# Patient Record
Sex: Female | Born: 1985 | Race: White | Hispanic: Yes | Marital: Married | Smoking: Never smoker
Health system: Southern US, Community
[De-identification: ages and names within clinical notes are randomized; demographics above are authoritative.]

## PROBLEM LIST (undated history)

## (undated) ENCOUNTER — Inpatient Hospital Stay (HOSPITAL_COMMUNITY): Payer: Medicaid Other

---

## 2007-04-10 LAB — CONVERTED CEMR LAB: Pap Smear: NORMAL

## 2008-01-03 ENCOUNTER — Ambulatory Visit: Payer: Self-pay | Admitting: Nurse Practitioner

## 2008-01-03 DIAGNOSIS — S139XXA Sprain of joints and ligaments of unspecified parts of neck, initial encounter: Secondary | ICD-10-CM | POA: Insufficient documentation

## 2008-01-03 LAB — CONVERTED CEMR LAB
Basophils Absolute: 0 10*3/uL (ref 0.0–0.1)
Chloride: 105 meq/L (ref 96–112)
Eosinophils Absolute: 0.1 10*3/uL (ref 0.0–0.7)
HCT: 39.1 % (ref 36.0–46.0)
Hemoglobin: 13.2 g/dL (ref 12.0–15.0)
Lymphocytes Relative: 39 % (ref 12–46)
Lymphs Abs: 3 10*3/uL (ref 0.7–4.0)
MCHC: 33.8 g/dL (ref 30.0–36.0)
MCV: 88.9 fL (ref 78.0–100.0)
Monocytes Relative: 7 % (ref 3–12)
Neutrophils Relative %: 52 % (ref 43–77)
Platelets: 269 10*3/uL (ref 150–400)
Total Bilirubin: 0.6 mg/dL (ref 0.3–1.2)
Total Protein: 7.8 g/dL (ref 6.0–8.3)
hCG, Beta Chain, Quant, S: 181.9 milliintl units/mL

## 2008-01-21 ENCOUNTER — Encounter (INDEPENDENT_AMBULATORY_CARE_PROVIDER_SITE_OTHER): Payer: Self-pay | Admitting: Nurse Practitioner

## 2008-02-17 ENCOUNTER — Inpatient Hospital Stay (HOSPITAL_COMMUNITY): Admission: AD | Admit: 2008-02-17 | Discharge: 2008-02-17 | Payer: Self-pay | Admitting: Obstetrics & Gynecology

## 2008-03-07 ENCOUNTER — Ambulatory Visit (HOSPITAL_COMMUNITY): Admission: RE | Admit: 2008-03-07 | Discharge: 2008-03-07 | Payer: Self-pay | Admitting: Family Medicine

## 2008-03-28 ENCOUNTER — Ambulatory Visit (HOSPITAL_COMMUNITY): Admission: RE | Admit: 2008-03-28 | Discharge: 2008-03-28 | Payer: Self-pay | Admitting: Family Medicine

## 2008-04-14 ENCOUNTER — Ambulatory Visit (HOSPITAL_COMMUNITY): Admission: RE | Admit: 2008-04-14 | Discharge: 2008-04-14 | Payer: Self-pay | Admitting: Family Medicine

## 2008-06-04 ENCOUNTER — Ambulatory Visit (HOSPITAL_COMMUNITY): Admission: RE | Admit: 2008-06-04 | Discharge: 2008-06-04 | Payer: Self-pay | Admitting: Family Medicine

## 2008-07-12 ENCOUNTER — Inpatient Hospital Stay (HOSPITAL_COMMUNITY): Admission: AD | Admit: 2008-07-12 | Discharge: 2008-08-03 | Payer: Self-pay | Admitting: Family Medicine

## 2008-07-12 ENCOUNTER — Ambulatory Visit: Payer: Self-pay | Admitting: Obstetrics & Gynecology

## 2009-11-08 IMAGING — US US OB COMP LESS 14 WK
1 series · 14 of 19 positions shown · non-contrast
Comparison: None

CLINICAL DATA: Pregnancy,

OBSTETRIC <14 WK US
TECHNIQUE: Transabdominal ultrasound examination was performed for
complete evaluation of the gestation as well as the maternal
uterus, adnexal regions, and pelvic cul-de-sac.

[Series 1: us ob comp less 14 wks · 14 of 19 slices shown]
[im 1/19]
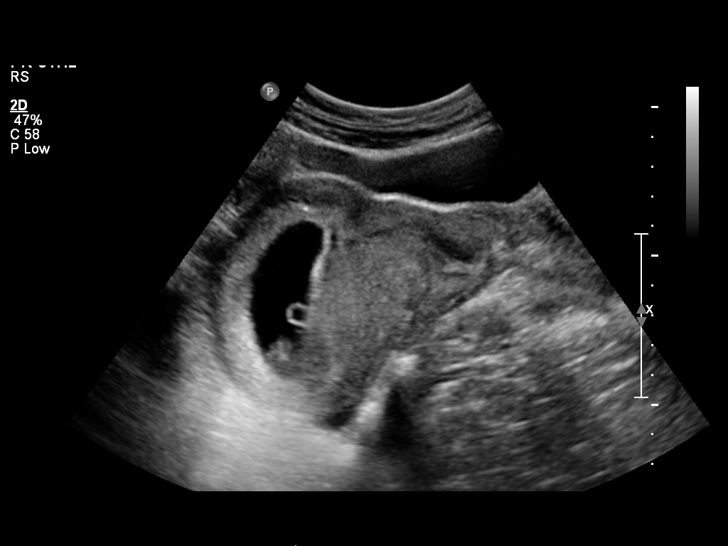
[im 3/19]
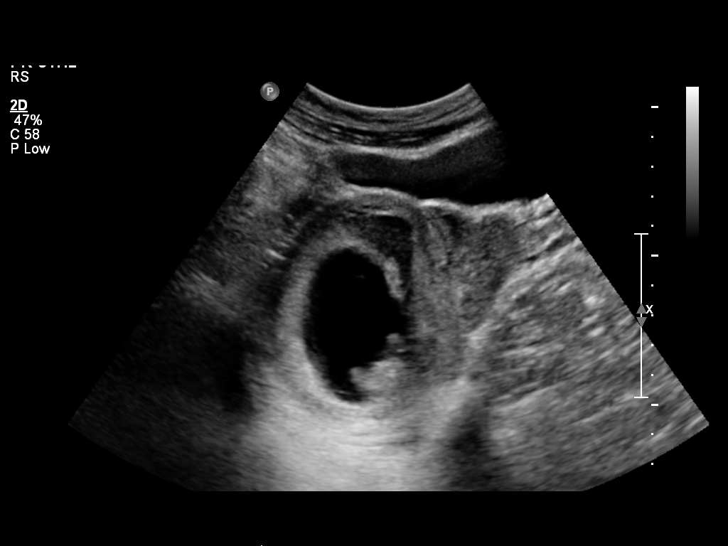
[im 4/19]
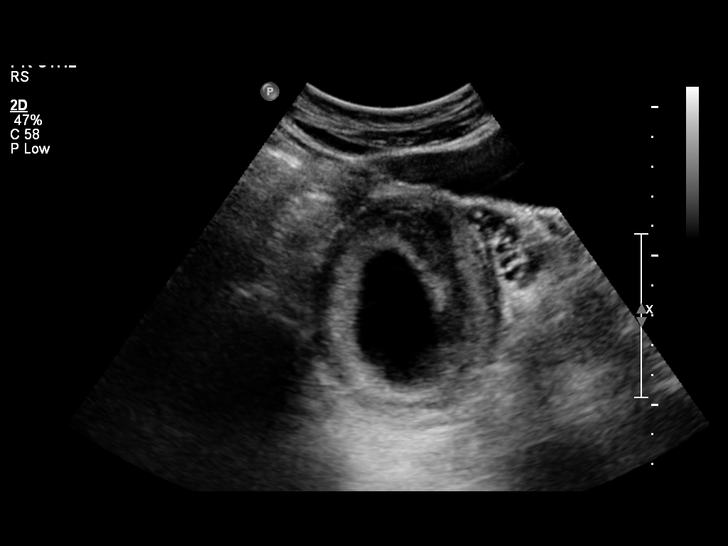
[im 5/19]
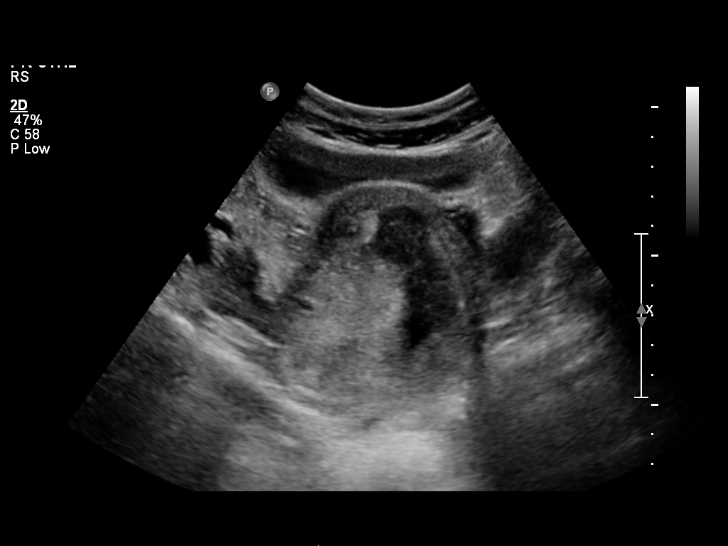
[im 7/19]
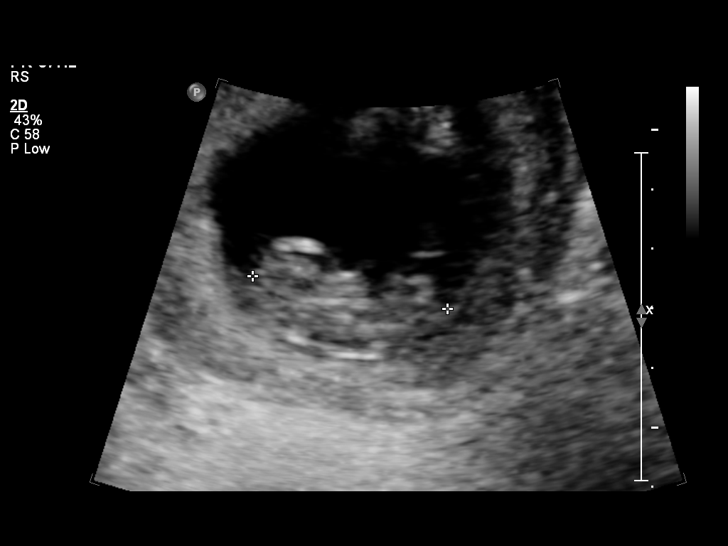
[im 8/19]
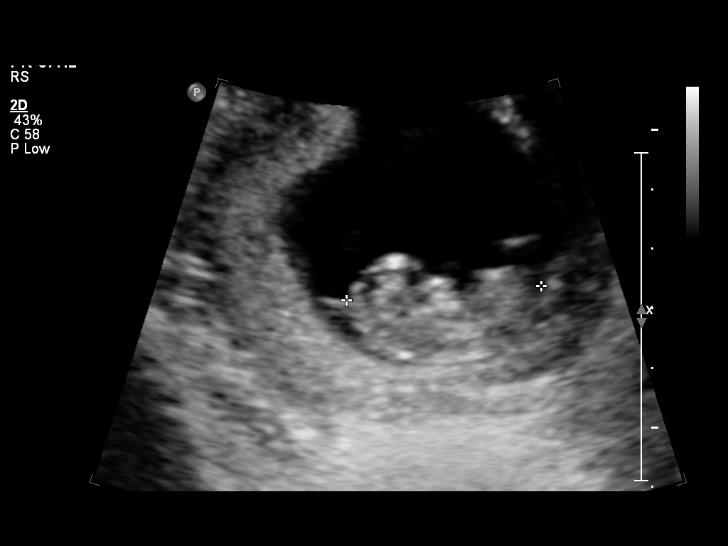
[im 9/19]
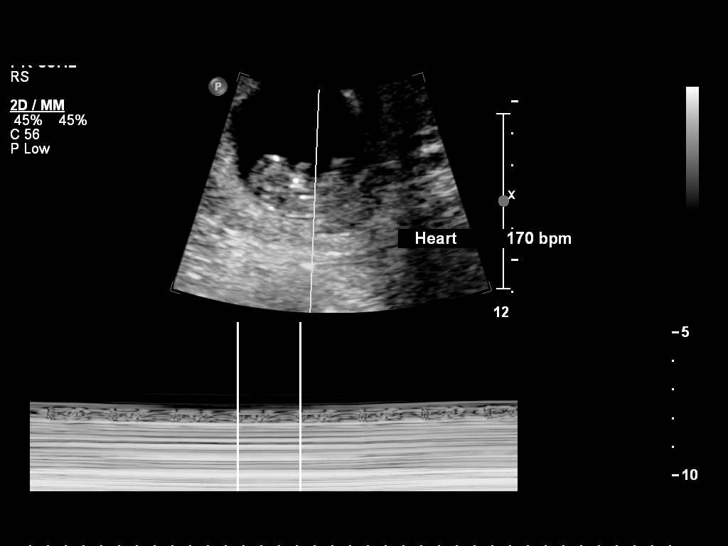
[im 11/19]
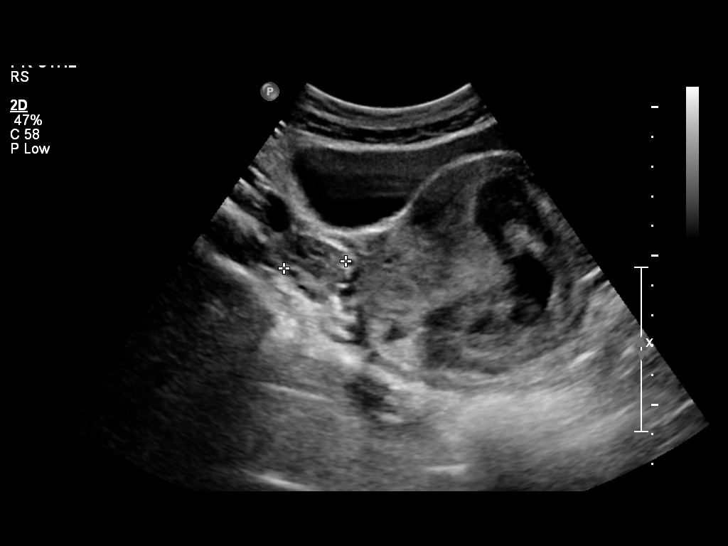
[im 12/19]
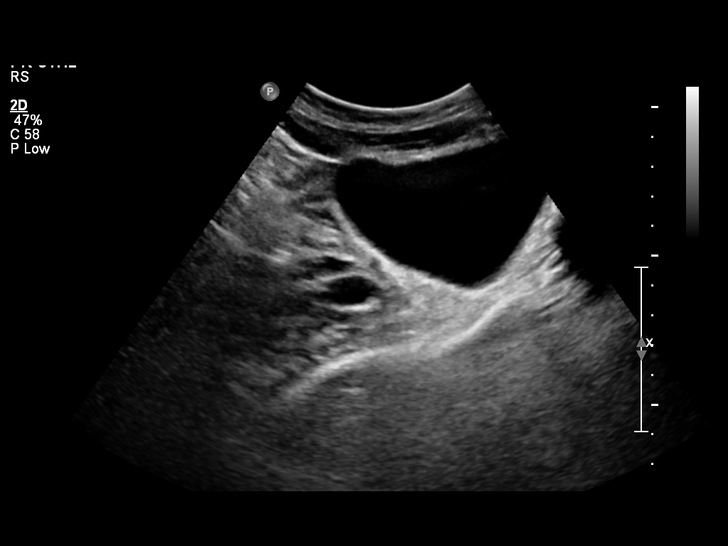
[im 13/19]
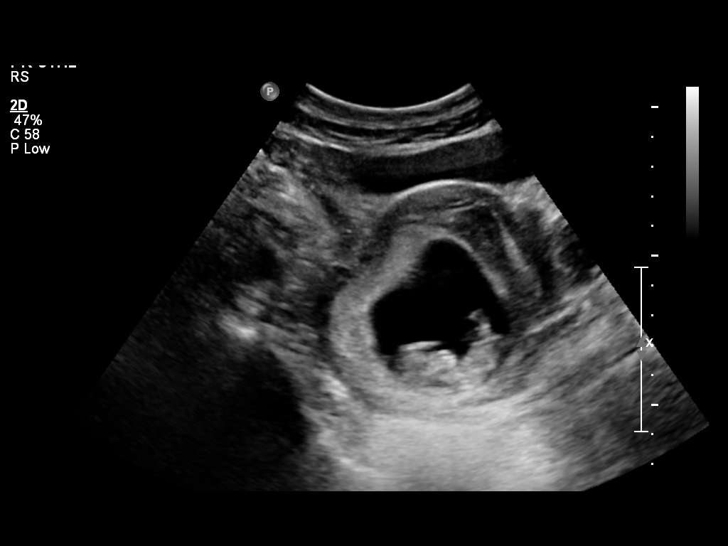
[im 15/19]
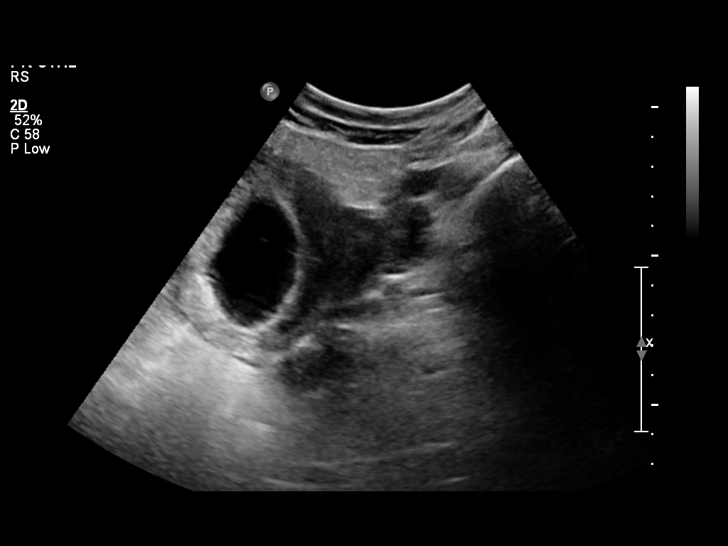
[im 16/19]
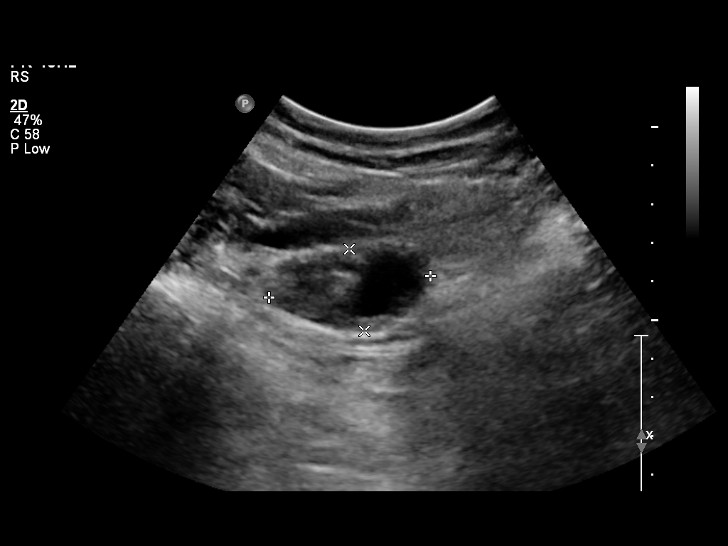
[im 17/19]
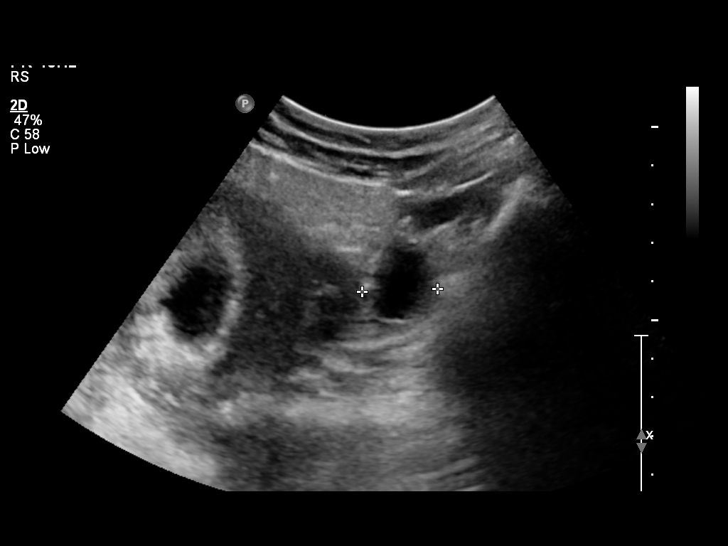
[im 19/19]
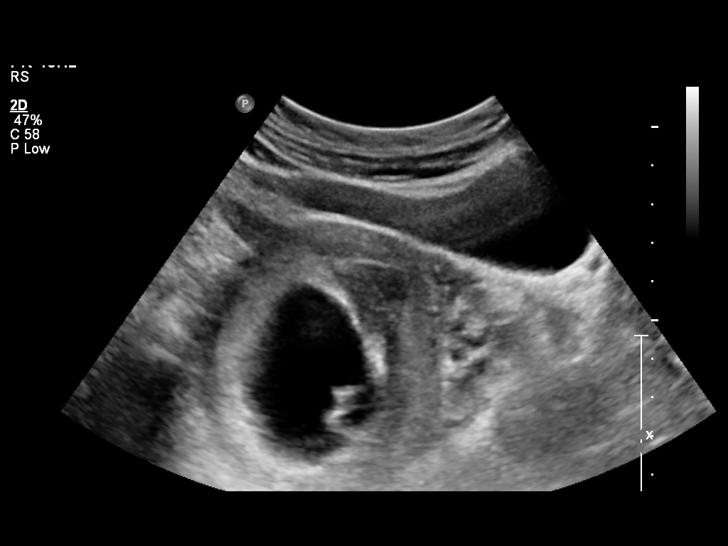

[14 of 19 positions shown; findings below may reference images not displayed]

FINDINGS: There is a single intrauterine gestation, with a crown-
rump length of 33.2 mm.  Estimated gestational age is 10 weeks 2
days.  Fetal heart rate 170 beats per minute.  Small to moderate
subchorionic hemorrhage present.

Ovaries are symmetric in size and echotexture.  No free fluid.
IMPRESSION: Single intrauterine pregnancy, 10 weeks 2 days.  Fetal heart rate
170 beats per minute.

Small to moderate subchorionic hemorrhage.

## 2010-09-21 NOTE — Letter (Signed)
Summary: PATIENT INFORMATION & HISTORY SHEET  PATIENT INFORMATION & HISTORY SHEET   Imported ByArta Bruce 01/04/2008 09:06:28  _____________________________________________________________________  External Attachment:    Type:   Image     Comment:   External Document

## 2011-01-04 NOTE — Discharge Summary (Signed)
[name], [name]               ACCOUNT NO.:  [id number]   MEDICAL RECORD NO.:  [id number]          PATIENT TYPE:  INP   LOCATION:  9316                          FACILITY:  [facility]   PHYSICIAN:  [name], M.D. DATE OF BIRTH:  06-Jun-1986   DATE OF ADMISSION:  07/12/2008  DATE OF DISCHARGE:  08/03/2008                               DISCHARGE SUMMARY   ATTENDING PHYSICIAN:  Karen Dukes, MD   ADMISSION DIAGNOSIS:  Preterm premature rupture of membranes and  observation and evaluation.   DISCHARGE DIAGNOSES:  Premature labor, prolonged rupture of membranes  for 20 days, and preterm delivery.   PROCEDURES DONE DURING HOSPITAL STAY:  Ultrasound, nonstress test, and  spontaneous vaginal delivery.   HISTORY AND HOSPITAL COURSE:  Briefly, this is a 25 year old G3, P2-1-0-  3 with a regular prenatal care at the [facility],  who was admitted at 31 weeks and 1 day with preterm premature rupture of  membranes, was admitted to antenatal unit for observation.  Got her  doses of betamethasone antibiotics for 7 days, Procardia as a tocolytic,  was observed on the antenatal floor for 20 days, and then went onto  induction of labor at 34 weeks.  The patient had no fevers or signs of  infections, and delivered a viable female by NSVD with Apgars of 8 at 1-  minute and 9 at 5 minutes with no anesthesia.  There was a three-vessel  cord.  Cord blood was sent.  There were no lacerations.  Estimated blood  loss was 300 mL.  Baby was sent to NICU in stable condition.  The  patient followed a routine normal postpartum course, and by day of  discharge, was ambulatory, tolerating diet, stooling and voiding and  pain was controlled and there was minimal lochia.  She prefers an IUD  placement, but prefers not to have contraceptive bridge until the  placement of the IUD.  She also prefers to be discharged home and not to  tell status.   Her labs are GBS negative, GC negative,  Chlamydia negative, RPR  nonreactive, blood type O positive, antibody screen negative, hepatitis  B surface antigen negative, rubella immune, varicella immune, and HIV  negative.   ACTIVITY:  Will be pelvic rest for 6 weeks.   DIET:  Will be routine.   MEDICATIONS AT DISCHARGE:  1. Ibuprofen 600 mg p.o. q.4-6 h. p.r.n. pain.  2. Colace 100 mg p.o. b.i.d. p.r.n. constipation.  3. Prenatal vitamins 1 tablet p.o. daily.   DISCHARGE CONDITION:  Stable.   DISCHARGE DISPOSITION:  Will be to home.   FOLLOWUP:  Will be in 6 weeks at the [facility] Department.      Karen Langton, MD      [name], M.D.  Electronically Signed    TT/MEDQ  D:  08/03/2008  T:  08/03/2008  Job:  [id number]

## 2011-05-19 LAB — CBC
Hemoglobin: 11.9 — ABNORMAL LOW
MCHC: 35.2
Platelets: 252
RDW: 14.3
WBC: 9

## 2011-05-19 LAB — URINALYSIS, ROUTINE W REFLEX MICROSCOPIC
Bilirubin Urine: NEGATIVE
Glucose, UA: NEGATIVE
Ketones, ur: NEGATIVE
Nitrite: NEGATIVE
Protein, ur: NEGATIVE
Specific Gravity, Urine: 1.02
Urobilinogen, UA: 0.2
pH: 6

## 2011-05-19 LAB — WET PREP, GENITAL

## 2011-05-19 LAB — GC/CHLAMYDIA PROBE AMP, GENITAL: Chlamydia, DNA Probe: NEGATIVE

## 2011-05-24 LAB — RPR: RPR Ser Ql: NONREACTIVE

## 2011-05-24 LAB — CBC
HCT: 31.3 % — ABNORMAL LOW (ref 36.0–46.0)
HCT: 34.1 % — ABNORMAL LOW (ref 36.0–46.0)
Hemoglobin: 12 g/dL (ref 12.0–15.0)
MCHC: 35.2 g/dL (ref 30.0–36.0)
MCHC: 35.2 g/dL (ref 30.0–36.0)
MCV: 93.4 fL (ref 78.0–100.0)
MCV: 93.8 fL (ref 78.0–100.0)
Platelets: 174 10*3/uL (ref 150–400)
RBC: 3.65 MIL/uL — ABNORMAL LOW (ref 3.87–5.11)
RDW: 14.1 % (ref 11.5–15.5)
WBC: 9.3 10*3/uL (ref 4.0–10.5)

## 2011-05-24 LAB — DIFFERENTIAL
Basophils Absolute: 0 10*3/uL (ref 0.0–0.1)
Lymphocytes Relative: 21 % (ref 12–46)
Monocytes Absolute: 0.8 10*3/uL (ref 0.1–1.0)
Neutrophils Relative %: 68 % (ref 43–77)

## 2011-05-24 LAB — STREP B DNA PROBE: Strep Group B Ag: NEGATIVE

## 2011-05-24 LAB — GC/CHLAMYDIA PROBE AMP, GENITAL: GC Probe Amp, Genital: NEGATIVE

## 2013-06-14 ENCOUNTER — Ambulatory Visit: Payer: Self-pay | Attending: Internal Medicine

## 2019-09-28 LAB — CYTOLOGY - PAP: Pap: NEGATIVE

## 2020-04-20 ENCOUNTER — Ambulatory Visit (INDEPENDENT_AMBULATORY_CARE_PROVIDER_SITE_OTHER): Payer: Medicaid Other | Admitting: Family Medicine

## 2020-04-20 ENCOUNTER — Encounter: Payer: Self-pay | Admitting: Family Medicine

## 2020-04-20 ENCOUNTER — Other Ambulatory Visit: Payer: Self-pay

## 2020-04-20 VITALS — BP 105/66 | HR 94 | Wt 129.0 lb

## 2020-04-20 DIAGNOSIS — O0990 Supervision of high risk pregnancy, unspecified, unspecified trimester: Secondary | ICD-10-CM | POA: Diagnosis not present

## 2020-04-20 DIAGNOSIS — Z8759 Personal history of other complications of pregnancy, childbirth and the puerperium: Secondary | ICD-10-CM | POA: Insufficient documentation

## 2020-04-20 DIAGNOSIS — O09529 Supervision of elderly multigravida, unspecified trimester: Secondary | ICD-10-CM | POA: Insufficient documentation

## 2020-04-20 HISTORY — DX: Personal history of other complications of pregnancy, childbirth and the puerperium: Z87.59

## 2020-04-20 MED ORDER — ASPIRIN EC 81 MG PO TBEC: 81.0000 mg | DELAYED_RELEASE_TABLET | Freq: Every day | ORAL | 2 refills | Status: DC

## 2020-04-20 NOTE — Progress Notes (Signed)
  Subjective:  [name] is a [id number] [redacted]w[redacted]d being seen today for her first obstetrical visit.  Her obstetrical history is significant for PPROM in last pregnancy with delivery at 34 weeks. Two full term deliveries before that. Patient does intend to breast feed. This is an unplanned, but desired pregnancy. Pregnancy history fully reviewed.  Patient reports no complaints.  BP 105/66   Pulse 94   Wt 129 lb (58.5 kg)   LMP 12/08/2019 (Exact Date)   HISTORY: OB History  Gravida Para Term Preterm AB Living  4 3 2 1  0 3  SAB TAB Ectopic Multiple Live Births  0 0 0 0 3    # Outcome Date GA Lbr Len/2nd Weight Sex Delivery Anes PTL Lv  4 Current           3 Preterm 08/01/08 [redacted]w[redacted]d  5 lb 8 oz (2.495 kg) M Vag-Spont None Y LIV  2 Term 07/14/05 [redacted]w[redacted]d  7 lb (3.175 kg) M Vag-Spont None  LIV  1 Term 10/12/03 [redacted]w[redacted]d  6 lb 8 oz (2.948 kg) F Vag-Spont None  LIV    History reviewed. No pertinent past medical history.  History reviewed. No pertinent surgical history.  Family History  Problem Relation Age of Onset  . Healthy Mother   . Healthy Father      Exam  BP 105/66   Pulse 94   Wt 129 lb (58.5 kg)   LMP 12/08/2019 (Exact Date)   Chaperone present during exam  CONSTITUTIONAL: Well-developed, well-nourished female in no acute distress.  HENT:  Normocephalic, atraumatic, External right and left ear normal. Oropharynx is clear and moist EYES: Conjunctivae and EOM are normal. Pupils are equal, round, and reactive to light. No scleral icterus.  NECK: Normal range of motion, supple, no masses.  Normal thyroid.  CARDIOVASCULAR: Normal heart rate noted, regular rhythm RESPIRATORY: Clear to auscultation bilaterally. Effort and breath sounds normal, no problems with respiration noted. BREASTS: declined ABDOMEN: Soft, normal bowel sounds, no distention noted.  No tenderness, rebound or guarding.  PELVIC: declined MUSCULOSKELETAL: Normal range of motion. No tenderness.  No cyanosis,  clubbing, or edema.  2+ distal pulses. SKIN: Skin is warm and dry. No rash noted. Not diaphoretic. No erythema. No pallor. NEUROLOGIC: Alert and oriented to person, place, and time. Normal reflexes, muscle tone coordination. No cranial nerve deficit noted. PSYCHIATRIC: Normal mood and affect. Normal behavior. Normal judgment and thought content.    Assessment:    Pregnancy: 12/10/2019 Patient Active Problem List   Diagnosis Date Noted  . Supervision of high risk pregnancy, antepartum 04/20/2020  . AMA (advanced maternal age) multigravida 35+, unspecified trimester 04/20/2020  . History of preterm premature rupture of membranes (PPROM) 04/20/2020  . NECK SPRAIN AND STRAIN 01/03/2008      Plan:   1. Supervision of high risk pregnancy, antepartum FHT and FH normal. Desires panorama Discussed delivery at [facility]. - [facility] AMB BABYSCRIPTS SCHEDULE OPTIMIZATION - CBC/D/Plt+RPR+Rh+ABO+Rub Ab... - [facility] MFM OB DETAIL +14 WK; Future - Culture, OB Urine - Genetic Screening - Hemoglobin A1c  2. AMA (advanced maternal age) multigravida 35+, unspecified trimester Start ASA 81mg  CMP, UP:C  3. History of preterm premature rupture of membranes (PPROM) Discussed Makena as prevention of PPROM.  She would like to think about it.     Problem list reviewed and updated. 75% of 30 min visit spent on counseling and coordination of care.     [place] 04/20/2020

## 2020-04-21 ENCOUNTER — Ambulatory Visit: Payer: Medicaid Other | Attending: Family Medicine

## 2020-04-21 ENCOUNTER — Encounter: Payer: Self-pay | Admitting: *Deleted

## 2020-04-21 ENCOUNTER — Telehealth: Payer: Self-pay

## 2020-04-21 ENCOUNTER — Ambulatory Visit: Payer: Medicaid Other | Admitting: *Deleted

## 2020-04-21 DIAGNOSIS — O0990 Supervision of high risk pregnancy, unspecified, unspecified trimester: Secondary | ICD-10-CM

## 2020-04-21 DIAGNOSIS — O09522 Supervision of elderly multigravida, second trimester: Secondary | ICD-10-CM | POA: Diagnosis not present

## 2020-04-21 DIAGNOSIS — O09212 Supervision of pregnancy with history of pre-term labor, second trimester: Secondary | ICD-10-CM | POA: Diagnosis not present

## 2020-04-21 DIAGNOSIS — Z3A19 19 weeks gestation of pregnancy: Secondary | ICD-10-CM | POA: Diagnosis not present

## 2020-04-21 LAB — CBC/D/PLT+RPR+RH+ABO+RUB AB...
Antibody Screen: NEGATIVE
Basophils Absolute: 0 10*3/uL (ref 0.0–0.2)
Basos: 0 %
EOS (ABSOLUTE): 0.1 10*3/uL (ref 0.0–0.4)
Eos: 2 %
HCV Ab: <0.1 s/co ratio (ref 0.0–0.9)
HIV Screen 4th Generation wRfx: NONREACTIVE
Hematocrit: 34.2 % (ref 34.0–46.6)
Hemoglobin: 11.6 g/dL (ref 11.1–15.9)
Hepatitis B Surface Ag: NEGATIVE
Immature Grans (Abs): 0.1 10*3/uL (ref 0.0–0.1)
Immature Granulocytes: 1 %
Lymphocytes Absolute: 1.3 10*3/uL (ref 0.7–3.1)
Lymphs: 17 %
MCH: 31.2 pg (ref 26.6–33.0)
MCHC: 33.9 g/dL (ref 31.5–35.7)
MCV: 92 fL (ref 79–97)
Monocytes Absolute: 0.5 10*3/uL (ref 0.1–0.9)
Monocytes: 7 %
Neutrophils Absolute: 5.6 10*3/uL (ref 1.4–7.0)
Neutrophils: 73 %
Platelets: 200 10*3/uL (ref 150–450)
RBC: 3.72 x10E6/uL — ABNORMAL LOW (ref 3.77–5.28)
RDW: 12.7 % (ref 11.7–15.4)
RPR Ser Ql: NONREACTIVE
Rh Factor: POSITIVE
Rubella Antibodies, IGG: 21.9 index (ref >0.99)
WBC: 7.6 10*3/uL (ref 3.4–10.8)

## 2020-04-21 LAB — COMPREHENSIVE METABOLIC PANEL
ALT: 11 IU/L (ref 0–32)
AST: 23 IU/L (ref 0–40)
Albumin/Globulin Ratio: 1.4 (ref 1.2–2.2)
Albumin: 4.1 g/dL (ref 3.8–4.8)
Alkaline Phosphatase: 85 IU/L (ref 48–121)
BUN/Creatinine Ratio: 12 (ref 9–23)
BUN: 8 mg/dL (ref 6–20)
Bilirubin Total: 0.3 mg/dL (ref 0.0–1.2)
CO2: 24 mmol/L (ref 20–29)
Calcium: 8.9 mg/dL (ref 8.7–10.2)
Chloride: 103 mmol/L (ref 96–106)
Creatinine, Ser: 0.68 mg/dL (ref 0.57–1.00)
GFR calc Af Amer: 130 mL/min/{1.73_m2} (ref >59)
GFR calc non Af Amer: 113 mL/min/{1.73_m2} (ref >59)
Globulin, Total: 3 g/dL (ref 1.5–4.5)
Glucose: 72 mg/dL (ref 65–99)
Potassium: 4.4 mmol/L (ref 3.5–5.2)
Sodium: 140 mmol/L (ref 134–144)
Total Protein: 7.1 g/dL (ref 6.0–8.5)

## 2020-04-21 LAB — HEMOGLOBIN A1C
Est. average glucose Bld gHb Est-mCnc: 85 mg/dL
Hgb A1c MFr Bld: 4.6 % — ABNORMAL LOW (ref 4.8–5.6)

## 2020-04-21 LAB — PROTEIN / CREATININE RATIO, URINE
Creatinine, Urine: 24 mg/dL
Protein, Ur: <4 mg/dL

## 2020-04-21 LAB — HCV INTERPRETATION

## 2020-04-21 NOTE — Telephone Encounter (Signed)
Mardene Celeste with [employer] (ID# [id number]) left voice message on home and mobile phone in regards to Aspirin medication asking if she could call us back with the name of her pharmacy or if she wants to come to the office to pick up paper Rx.   Ladona Ridgel, RN

## 2020-04-23 ENCOUNTER — Encounter: Payer: Self-pay | Admitting: *Deleted

## 2020-04-23 LAB — URINE CULTURE, OB REFLEX

## 2020-04-23 LAB — CULTURE, OB URINE

## 2020-05-06 ENCOUNTER — Telehealth: Payer: Self-pay | Admitting: Lactation Services

## 2020-05-06 NOTE — Telephone Encounter (Signed)
Called patient with assistance of [employer], Research officer, trade union.   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   Recommended she call [name] to make a telephone Genetic Counseling session. Phone number to [place] given.   Reviewed it will be recommended that FOB also be tested and will direct her how to do so.   Patient with no questions or concerns at this time.

## 2020-05-21 ENCOUNTER — Encounter: Payer: Self-pay | Admitting: *Deleted

## 2020-05-22 ENCOUNTER — Ambulatory Visit (INDEPENDENT_AMBULATORY_CARE_PROVIDER_SITE_OTHER): Payer: Medicaid Other | Admitting: Family Medicine

## 2020-05-22 ENCOUNTER — Other Ambulatory Visit: Payer: Self-pay

## 2020-05-22 VITALS — BP 100/55 | HR 82 | Wt 134.0 lb

## 2020-05-22 DIAGNOSIS — Z8759 Personal history of other complications of pregnancy, childbirth and the puerperium: Secondary | ICD-10-CM

## 2020-05-22 DIAGNOSIS — O09529 Supervision of elderly multigravida, unspecified trimester: Secondary | ICD-10-CM

## 2020-05-22 DIAGNOSIS — O0990 Supervision of high risk pregnancy, unspecified, unspecified trimester: Secondary | ICD-10-CM

## 2020-05-22 MED ORDER — ASPIRIN EC 81 MG PO TBEC
81.0000 mg | DELAYED_RELEASE_TABLET | Freq: Every day | ORAL | 2 refills | Status: DC
Start: 2020-05-22 — End: 2020-08-31

## 2020-05-22 MED ORDER — BLOOD PRESSURE KIT DEVI: 1.0000 | Freq: Every day | 0 refills | Status: AC

## 2020-05-22 NOTE — Progress Notes (Signed)
Sent [facility]

## 2020-05-22 NOTE — Progress Notes (Signed)
   PRENATAL VISIT NOTE  Subjective:  [name] is a 34 y.o. [phone] at 83w5dbeing seen today for ongoing prenatal care.  She is currently monitored for the following issues for this high-risk pregnancy and has NECK SPRAIN AND STRAIN; Supervision of high risk pregnancy, antepartum; AMA (advanced maternal age) multigravida 35+, unspecified trimester; and History of preterm premature rupture of membranes (PPROM) on their problem list.  Patient reports no complaints.  Contractions: Not present. Vag. Bleeding: None.  Movement: Present. Denies leaking of fluid.   The following portions of the patient's history were reviewed and updated as appropriate: allergies, current medications, past family history, past medical history, past social history, past surgical history and problem list.   Objective:   Vitals:   05/22/20 1110  BP: (!) 100/55  Pulse: 82  Weight: 134 lb (60.8 kg)    Fetal Status: Fetal Heart Rate (bpm): 144 Fundal Height: 24 cm Movement: Present     General:  Alert, oriented and cooperative. Patient is in no acute distress.  Skin: Skin is warm and dry. No rash noted.   Cardiovascular: Normal heart rate noted  Respiratory: Normal respiratory effort, no problems with respiration noted  Abdomen: Soft, gravid, appropriate for gestational age.  Pain/Pressure: Absent     Pelvic: Cervical exam deferred        Extremities: Normal range of motion.  Edema: None  Mental Status: Normal mood and affect. Normal behavior. Normal judgment and thought content.   Assessment and Plan:  Pregnancy: [id number] 211w5d. Supervision of high risk pregnancy, antepartum FHT and FH normal. Husband getting tested for SMA - Blood Pressure Monitoring (BLOOD PRESSURE KIT) DEVI; 1 Device by Does not apply route daily. ICD 10: Z34.00  Dispense: 1 each; Refill: 0  2. AMA (advanced maternal age) multigravida 3564+unspecified trimester ASA 8154m3. History of preterm premature rupture of membranes  (PPROM) Declined Makena  Preterm labor symptoms and general obstetric precautions including but not limited to vaginal bleeding, contractions, leaking of fluid and fetal movement were reviewed in detail with the patient. Please refer to After Visit Summary for other counseling recommendations.   Return in about 4 weeks (around 06/19/2020) for HR OB f/u, In Office.  No future appointments.  Jac[place]ruett MainlandO

## 2020-06-16 ENCOUNTER — Other Ambulatory Visit: Payer: Self-pay

## 2020-06-16 DIAGNOSIS — O0990 Supervision of high risk pregnancy, unspecified, unspecified trimester: Secondary | ICD-10-CM

## 2020-06-19 ENCOUNTER — Other Ambulatory Visit: Payer: Self-pay

## 2020-06-19 ENCOUNTER — Other Ambulatory Visit: Payer: Medicaid Other

## 2020-06-19 ENCOUNTER — Ambulatory Visit (INDEPENDENT_AMBULATORY_CARE_PROVIDER_SITE_OTHER): Payer: Medicaid Other | Admitting: Obstetrics and Gynecology

## 2020-06-19 VITALS — BP 111/62 | HR 78 | Wt 136.9 lb

## 2020-06-19 DIAGNOSIS — Z8759 Personal history of other complications of pregnancy, childbirth and the puerperium: Secondary | ICD-10-CM

## 2020-06-19 DIAGNOSIS — Z3A27 27 weeks gestation of pregnancy: Secondary | ICD-10-CM

## 2020-06-19 DIAGNOSIS — O0990 Supervision of high risk pregnancy, unspecified, unspecified trimester: Secondary | ICD-10-CM

## 2020-06-19 DIAGNOSIS — Z7189 Other specified counseling: Secondary | ICD-10-CM

## 2020-06-19 DIAGNOSIS — O09522 Supervision of elderly multigravida, second trimester: Secondary | ICD-10-CM

## 2020-06-19 DIAGNOSIS — Z23 Encounter for immunization: Secondary | ICD-10-CM

## 2020-06-19 DIAGNOSIS — O0992 Supervision of high risk pregnancy, unspecified, second trimester: Secondary | ICD-10-CM

## 2020-06-19 DIAGNOSIS — O09529 Supervision of elderly multigravida, unspecified trimester: Secondary | ICD-10-CM

## 2020-06-19 MED ORDER — TETANUS-DIPHTH-ACELL PERTUSSIS 5-2.5-18.5 LF-MCG/0.5 IM SUSY
0.5000 mL | PREFILLED_SYRINGE | Freq: Once | INTRAMUSCULAR | Status: AC
  Administered 2020-06-19: 0.5 mL via INTRAMUSCULAR

## 2020-06-19 NOTE — Progress Notes (Signed)
Prenatal Visit Note Date: 06/19/2020 Clinic: [facility]  Subjective:  [name] is a 34 y.o. [phone] at [redacted]w[redacted]d being seen today for ongoing prenatal care.  She is currently monitored for the following issues for this low-risk pregnancy and has NECK SPRAIN AND STRAIN; Supervision of high risk pregnancy, antepartum; AMA (advanced maternal age) multigravida 35+, unspecified trimester; and History of preterm premature rupture of membranes (PPROM) on their problem list.  Patient reports no complaints.   Contractions: Not present. Vag. Bleeding: None.  Movement: Present. Denies leaking of fluid.   The following portions of the patient's history were reviewed and updated as appropriate: allergies, current medications, past family history, past medical history, past social history, past surgical history and problem list. Problem list updated.  Objective:   Vitals:   06/19/20 0829  BP: 111/62  Pulse: 78  Weight: 136 lb 14.4 oz (62.1 kg)    Fetal Status: Fetal Heart Rate (bpm): 145   Movement: Present     General:  Alert, oriented and cooperative. Patient is in no acute distress.  Skin: Skin is warm and dry. No rash noted.   Cardiovascular: Normal heart rate noted  Respiratory: Normal respiratory effort, no problems with respiration noted  Abdomen: Soft, gravid, appropriate for gestational age. Pain/Pressure: Absent     Pelvic:  Cervical exam deferred        Extremities: Normal range of motion.  Edema: None  Mental Status: Normal mood and affect. Normal behavior. Normal judgment and thought content.   Urinalysis:      Assessment and Plan:  Pregnancy: [id number] at [redacted]w[redacted]d  1. Supervision of high risk pregnancy, antepartum 28wk labs today. BC d/w her and she is b/w Mirena and BTL. D/w her re: BTL. She states she has [employer] and no 2ndry medicaid. She would like general if she gets BTL. I told her re: r/b/a to both options and I told her she has time to think about it and  we can talk to her more about it later visits. tdap today  COVID-19 Vaccine Counseling: The patient was counseled on the potential benefits and lack of known risks of COVID vaccination, during pregnancy and breastfeeding, during today's visit. The patient's questions and concerns were addressed today, including safety of the vaccination and potential side effects as they have been published by ACOG and SMFM. The patient has been informed that there have not been any documented vaccine related injuries, deaths or birth defects to infant or mom after receiving the COVID-19 vaccine to date. The patient has been made aware that although she is not at increased risk of contracting COVID-19 during pregnancy, she is at increased risk of developing severe disease and complications if she contracts COVID-19 while pregnant. All patient questions were addressed during our visit today. The patient is not planning to get vaccinated at this time.    2. AMA (advanced maternal age) multigravida 35+, unspecified trimester No issues  3. History of preterm premature rupture of membranes (PPROM) Declined 17p  Preterm labor symptoms and general obstetric precautions including but not limited to vaginal bleeding, contractions, leaking of fluid and fetal movement were reviewed in detail with the patient. Please refer to After Visit Summary for other counseling recommendations.  Return in about 2 weeks (around 07/03/2020) for 2-3wk low risk, in person.   [place] Bing, MD

## 2020-06-20 LAB — CBC
Hematocrit: 31.9 % — ABNORMAL LOW (ref 34.0–46.6)
Hemoglobin: 10.6 g/dL — ABNORMAL LOW (ref 11.1–15.9)
MCH: 29.4 pg (ref 26.6–33.0)
MCHC: 33.2 g/dL (ref 31.5–35.7)
MCV: 89 fL (ref 79–97)
Platelets: 184 10*3/uL (ref 150–450)
RBC: 3.6 x10E6/uL — ABNORMAL LOW (ref 3.77–5.28)
RDW: 13.5 % (ref 11.7–15.4)
WBC: 6.5 10*3/uL (ref 3.4–10.8)

## 2020-06-20 LAB — RPR: RPR Ser Ql: NONREACTIVE

## 2020-06-20 LAB — GLUCOSE TOLERANCE, 2 HOURS W/ 1HR
Glucose, 1 hour: 158 mg/dL (ref 65–179)
Glucose, 2 hour: 116 mg/dL (ref 65–152)
Glucose, Fasting: 76 mg/dL (ref 65–91)

## 2020-06-20 LAB — HIV ANTIBODY (ROUTINE TESTING W REFLEX): HIV Screen 4th Generation wRfx: NONREACTIVE

## 2020-06-21 ENCOUNTER — Other Ambulatory Visit: Payer: Self-pay | Admitting: Obstetrics and Gynecology

## 2020-06-21 MED ORDER — FERROUS GLUCONATE 324 (38 FE) MG PO TABS: 324.0000 mg | ORAL_TABLET | Freq: Every day | ORAL | 0 refills | Status: DC

## 2020-07-06 ENCOUNTER — Other Ambulatory Visit: Payer: Self-pay

## 2020-07-06 ENCOUNTER — Encounter: Payer: Self-pay | Admitting: Certified Nurse Midwife

## 2020-07-06 ENCOUNTER — Ambulatory Visit (INDEPENDENT_AMBULATORY_CARE_PROVIDER_SITE_OTHER): Payer: Medicaid Other | Admitting: Certified Nurse Midwife

## 2020-07-06 VITALS — BP 125/69 | HR 97 | Wt 136.2 lb

## 2020-07-06 DIAGNOSIS — Z3009 Encounter for other general counseling and advice on contraception: Secondary | ICD-10-CM

## 2020-07-06 DIAGNOSIS — O0990 Supervision of high risk pregnancy, unspecified, unspecified trimester: Secondary | ICD-10-CM

## 2020-07-06 DIAGNOSIS — Z3A30 30 weeks gestation of pregnancy: Secondary | ICD-10-CM

## 2020-07-06 NOTE — Patient Instructions (Signed)

## 2020-07-06 NOTE — Progress Notes (Signed)
Pt still not sure if she wants Tubal or IUD.

## 2020-07-06 NOTE — Progress Notes (Signed)
   PRENATAL VISIT NOTE  Subjective:  [name] is a 34 y.o. [phone] at [redacted]w[redacted]d being seen today for ongoing prenatal care.  She is currently monitored for the following issues for this low-risk pregnancy and has NECK SPRAIN AND STRAIN; Supervision of high risk pregnancy, antepartum; AMA (advanced maternal age) multigravida 35+, unspecified trimester; and History of preterm premature rupture of membranes (PPROM) on their problem list.  Patient reports no complaints.  Contractions: Not present. Vag. Bleeding: None.  Movement: Present. Denies leaking of fluid.   The following portions of the patient's history were reviewed and updated as appropriate: allergies, current medications, past family history, past medical history, past social history, past surgical history and problem list.   Objective:   Vitals:   07/06/20 0946  BP: 125/69  Pulse: 97  Weight: 136 lb 3.2 oz (61.8 kg)    Fetal Status: Fetal Heart Rate (bpm): 145 Fundal Height: 27 cm Movement: Present     General:  Alert, oriented and cooperative. Patient is in no acute distress.  Skin: Skin is warm and dry. No rash noted.   Cardiovascular: Normal heart rate noted  Respiratory: Normal respiratory effort, no problems with respiration noted  Abdomen: Soft, gravid, appropriate for gestational age.  Pain/Pressure: Absent     Pelvic: Cervical exam deferred        Extremities: Normal range of motion.  Edema: None  Mental Status: Normal mood and affect. Normal behavior. Normal judgment and thought content.   Assessment and Plan:  Pregnancy: [id number] at [redacted]w[redacted]d 1. Supervision of high risk pregnancy, antepartum - patient doing well, no complaints or concerns at this time  - routine prenatal care - anticipatory guidance on upcoming appointments   2. Birth control counseling - educated and discussed birth control options with patient  - patient is considering IUD vs BTL, patient wants a BTL but is nervous about surgery so that is why  she has not made up her mind - Discussed with patient that Medicaid paperwork needs to be signed 30 days prior to delivery, discussed with patient that it does not lock her into the surgery and she can change her mind again, patient verbalizes understanding  - BTL paperwork completed today   3. [redacted] weeks gestation of pregnancy  Preterm labor symptoms and general obstetric precautions including but not limited to vaginal bleeding, contractions, leaking of fluid and fetal movement were reviewed in detail with the patient. Please refer to After Visit Summary for other counseling recommendations.   Return in about 2 weeks (around 07/20/2020) for LROB, in person.  Future Appointments  Date Time Provider Department Center  07/20/2020  3:55 PM Briant Sites [facility]    Sharyon Cable, [place]

## 2020-07-20 ENCOUNTER — Ambulatory Visit (INDEPENDENT_AMBULATORY_CARE_PROVIDER_SITE_OTHER): Payer: Medicaid Other | Admitting: Advanced Practice Midwife

## 2020-07-20 ENCOUNTER — Other Ambulatory Visit: Payer: Self-pay

## 2020-07-20 VITALS — BP 107/67 | HR 90 | Wt 139.0 lb

## 2020-07-20 DIAGNOSIS — O0990 Supervision of high risk pregnancy, unspecified, unspecified trimester: Secondary | ICD-10-CM

## 2020-07-20 DIAGNOSIS — Z3A32 32 weeks gestation of pregnancy: Secondary | ICD-10-CM

## 2020-07-20 DIAGNOSIS — Z3009 Encounter for other general counseling and advice on contraception: Secondary | ICD-10-CM

## 2020-07-20 NOTE — Patient Instructions (Signed)
Fetal Movement Counts Patient Name: ________________________________________________ Patient Due Date: ____________________ What is a fetal movement count?  A fetal movement count is the number of times that you feel your baby move during a certain amount of time. This may also be called a fetal kick count. A fetal movement count is recommended for every pregnant woman. You may be asked to start counting fetal movements as early as week 28 of your pregnancy. Pay attention to when your baby is most active. You may notice your baby's sleep and wake cycles. You may also notice things that make your baby move more. You should do a fetal movement count:  When your baby is normally most active.  At the same time each day. A good time to count movements is while you are resting, after having something to eat and drink. How do I count fetal movements? 1. Find a quiet, comfortable area. Sit, or lie down on your side. 2. Write down the date, the start time and stop time, and the number of movements that you felt between those two times. Take this information with you to your health care visits. 3. Write down your start time when you feel the first movement. 4. Count kicks, flutters, swishes, rolls, and jabs. You should feel at least 10 movements. 5. You may stop counting after you have felt 10 movements, or if you have been counting for 2 hours. Write down the stop time. 6. If you do not feel 10 movements in 2 hours, contact your health care provider for further instructions. Your health care provider may want to do additional tests to assess your baby's well-being. Contact a health care provider if:  You feel fewer than 10 movements in 2 hours.  Your baby is not moving like he or she usually does. Date: ____________ Start time: ____________ Stop time: ____________ Movements: ____________ Date: ____________ Start time: ____________ Stop time: ____________ Movements: ____________ Date: ____________  Start time: ____________ Stop time: ____________ Movements: ____________ Date: ____________ Start time: ____________ Stop time: ____________ Movements: ____________ Date: ____________ Start time: ____________ Stop time: ____________ Movements: ____________ Date: ____________ Start time: ____________ Stop time: ____________ Movements: ____________ Date: ____________ Start time: ____________ Stop time: ____________ Movements: ____________ Date: ____________ Start time: ____________ Stop time: ____________ Movements: ____________ Date: ____________ Start time: ____________ Stop time: ____________ Movements: ____________ This information is not intended to replace advice given to you by your health care provider. Make sure you discuss any questions you have with your health care provider. Document Revised: 03/28/2019 Document Reviewed: 03/28/2019 Elsevier Patient Education  2020 [employer] Inc[employer]  

## 2020-07-20 NOTE — Progress Notes (Signed)
   PRENATAL VISIT NOTE  Subjective:  [name] is a 34 y.o. [phone] at [redacted]w[redacted]d being seen today for ongoing prenatal care.  She is currently monitored for the following issues for this high-risk pregnancy and has NECK SPRAIN AND STRAIN; Supervision of high risk pregnancy, antepartum; AMA (advanced maternal age) multigravida 35+, unspecified trimester; and History of preterm premature rupture of membranes (PPROM) on their problem list.  Patient reports no complaints.  Contractions: Irritability. Vag. Bleeding: None.  Movement: Present. Denies leaking of fluid.   The following portions of the patient's history were reviewed and updated as appropriate: allergies, current medications, past family history, past medical history, past social history, past surgical history and problem list. Problem list updated.  Objective:   Vitals:   07/20/20 1626  BP: 107/67  Pulse: 90  Weight: 139 lb (63 kg)    Fetal Status: Fetal Heart Rate (bpm): 143 Fundal Height: 30 cm Movement: Present     General:  Alert, oriented and cooperative. Patient is in no acute distress.  Skin: Skin is warm and dry. No rash noted.   Cardiovascular: Normal heart rate noted  Respiratory: Normal respiratory effort, no problems with respiration noted  Abdomen: Soft, gravid, appropriate for gestational age.  Pain/Pressure: Absent     Pelvic: Cervical exam deferred        Extremities: Normal range of motion.  Edema: None  Mental Status: Normal mood and affect. Normal behavior. Normal judgment and thought content.   Assessment and Plan:  Pregnancy: [id number] at [redacted]w[redacted]d  1. Supervision of high risk pregnancy, antepartum - No concerns - Preemptive teaching about remaining visits  2. [redacted] weeks gestation of pregnancy   3. Unwanted fertility - BTL consent signed 07/06/2020  Preterm labor symptoms and general obstetric precautions including but not limited to vaginal bleeding, contractions, leaking of fluid and fetal movement  were reviewed in detail with the patient. Please refer to After Visit Summary for other counseling recommendations.  Return in about 2 weeks (around 08/03/2020).   Calvert Cantor, CNM

## 2020-08-03 ENCOUNTER — Ambulatory Visit (INDEPENDENT_AMBULATORY_CARE_PROVIDER_SITE_OTHER): Payer: Medicaid Other | Admitting: Obstetrics & Gynecology

## 2020-08-03 ENCOUNTER — Other Ambulatory Visit: Payer: Self-pay

## 2020-08-03 VITALS — BP 108/65 | HR 86 | Temp 98.0°F | Wt 140.0 lb

## 2020-08-03 DIAGNOSIS — Z8759 Personal history of other complications of pregnancy, childbirth and the puerperium: Secondary | ICD-10-CM

## 2020-08-03 DIAGNOSIS — O0990 Supervision of high risk pregnancy, unspecified, unspecified trimester: Secondary | ICD-10-CM

## 2020-08-03 NOTE — Patient Instructions (Signed)

## 2020-08-03 NOTE — Progress Notes (Signed)
   PRENATAL VISIT NOTE  Subjective:  [name] is a 34 y.o. [phone] at [redacted]w[redacted]d being seen today for ongoing prenatal care.  She is currently monitored for the following issues for this low-risk pregnancy and has NECK SPRAIN AND STRAIN; Supervision of high risk pregnancy, antepartum; AMA (advanced maternal age) multigravida 35+, unspecified trimester; and History of preterm premature rupture of membranes (PPROM) on their problem list.  Patient reports no complaints.  Contractions: Not present. Vag. Bleeding: None.  Movement: Present. Denies leaking of fluid.   The following portions of the patient's history were reviewed and updated as appropriate: allergies, current medications, past family history, past medical history, past social history, past surgical history and problem list.   Objective:   Vitals:   08/03/20 1629  BP: 108/65  Pulse: 86  Temp: 98 F (36.7 C)  Weight: 140 lb (63.5 kg)    Fetal Status: Fetal Heart Rate (bpm): 135 Fundal Height: 34 cm Movement: Present     General:  Alert, oriented and cooperative. Patient is in no acute distress.  Skin: Skin is warm and dry. No rash noted.   Cardiovascular: Normal heart rate noted  Respiratory: Normal respiratory effort, no problems with respiration noted  Abdomen: Soft, gravid, appropriate for gestational age.  Pain/Pressure: Absent     Pelvic: Cervical exam deferred        Extremities: Normal range of motion.  Edema: None  Mental Status: Normal mood and affect. Normal behavior. Normal judgment and thought content.   Assessment and Plan:  Pregnancy: [id number] at [redacted]w[redacted]d There are no diagnoses linked to this encounter. Preterm labor symptoms and general obstetric precautions including but not limited to vaginal bleeding, contractions, leaking of fluid and fetal movement were reviewed in detail with the patient. Please refer to After Visit Summary for other counseling recommendations.  History of preterm premature rupture of  membranes (PPROM)  Supervision of high risk pregnancy, antepartum  Doing well Return in about 2 weeks (around 08/17/2020) for GBS test.  No future appointments.  Scheryl Darter, MD

## 2020-08-19 ENCOUNTER — Other Ambulatory Visit: Payer: Self-pay

## 2020-08-19 ENCOUNTER — Ambulatory Visit (INDEPENDENT_AMBULATORY_CARE_PROVIDER_SITE_OTHER): Payer: Medicaid Other | Admitting: Medical

## 2020-08-19 ENCOUNTER — Other Ambulatory Visit (HOSPITAL_COMMUNITY)
Admission: RE | Admit: 2020-08-19 | Discharge: 2020-08-19 | Disposition: A | Discharge status: Home / Self Care | Payer: Medicaid Other | Source: Ambulatory Visit | Attending: Medical | Admitting: Medical

## 2020-08-19 VITALS — BP 110/63 | HR 93 | Wt 141.7 lb

## 2020-08-19 DIAGNOSIS — O0990 Supervision of high risk pregnancy, unspecified, unspecified trimester: Secondary | ICD-10-CM | POA: Insufficient documentation

## 2020-08-19 DIAGNOSIS — Z8759 Personal history of other complications of pregnancy, childbirth and the puerperium: Secondary | ICD-10-CM

## 2020-08-19 DIAGNOSIS — Z3A36 36 weeks gestation of pregnancy: Secondary | ICD-10-CM

## 2020-08-19 DIAGNOSIS — O09529 Supervision of elderly multigravida, unspecified trimester: Secondary | ICD-10-CM

## 2020-08-19 NOTE — Progress Notes (Signed)
   PRENATAL VISIT NOTE  Subjective:  [name] is a 34 y.o. [phone] at [redacted]w[redacted]d being seen today for ongoing prenatal care.  She is currently monitored for the following issues for this high-risk pregnancy and has NECK SPRAIN AND STRAIN; Supervision of high risk pregnancy, antepartum; AMA (advanced maternal age) multigravida 35+, unspecified trimester; and History of preterm premature rupture of membranes (PPROM) on their problem list.  Patient reports no complaints.  Contractions: Not present. Vag. Bleeding: None.  Movement: Present. Denies leaking of fluid.   The following portions of the patient's history were reviewed and updated as appropriate: allergies, current medications, past family history, past medical history, past social history, past surgical history and problem list.   Objective:   Vitals:   08/19/20 1445  BP: 110/63  Pulse: 93  Weight: 141 lb 11.2 oz (64.3 kg)    Fetal Status: Fetal Heart Rate (bpm): 161 Fundal Height: 36 cm Movement: Present  Presentation: Vertex  General:  Alert, oriented and cooperative. Patient is in no acute distress.  Skin: Skin is warm and dry. No rash noted.   Cardiovascular: Normal heart rate noted  Respiratory: Normal respiratory effort, no problems with respiration noted  Abdomen: Soft, gravid, appropriate for gestational age.  Pain/Pressure: Absent     Pelvic: Cervical exam performed in the presence of a chaperone Dilation: 3 Effacement (%): 50 Station: -3  Extremities: Normal range of motion.  Edema: None  Mental Status: Normal mood and affect. Normal behavior. Normal judgment and thought content.   Assessment and Plan:  Pregnancy: [id number] at [redacted]w[redacted]d 1. Supervision of high risk pregnancy, antepartum - GC/Chlamydia probe amp ([place])not at [facility] - Culture, beta strep (group b only)  2. AMA (advanced maternal age) multigravida 35+, unspecified trimester - < 46 years old, no change in management   3. History of preterm premature  rupture of membranes (PPROM)  4. [redacted] weeks gestation of pregnancy  Preterm labor symptoms and general obstetric precautions including but not limited to vaginal bleeding, contractions, leaking of fluid and fetal movement were reviewed in detail with the patient. Please refer to After Visit Summary for other counseling recommendations.   Return in about 1 week (around 08/26/2020) for LOB, In-Person.  No future appointments.  Vonzella Nipple, PA-C

## 2020-08-19 NOTE — Patient Instructions (Signed)
Fetal Movement Counts Patient Name: ________________________________________________ Patient Due Date: ____________________ What is a fetal movement count?  A fetal movement count is the number of times that you feel your baby move during a certain amount of time. This may also be called a fetal kick count. A fetal movement count is recommended for every pregnant woman. You may be asked to start counting fetal movements as early as week 28 of your pregnancy. Pay attention to when your baby is most active. You may notice your baby's sleep and wake cycles. You may also notice things that make your baby move more. You should do a fetal movement count:  When your baby is normally most active.  At the same time each day. A good time to count movements is while you are resting, after having something to eat and drink. How do I count fetal movements? 1. Find a quiet, comfortable area. Sit, or lie down on your side. 2. Write down the date, the start time and stop time, and the number of movements that you felt between those two times. Take this information with you to your health care visits. 3. Write down your start time when you feel the first movement. 4. Count kicks, flutters, swishes, rolls, and jabs. You should feel at least 10 movements. 5. You may stop counting after you have felt 10 movements, or if you have been counting for 2 hours. Write down the stop time. 6. If you do not feel 10 movements in 2 hours, contact your health care provider for further instructions. Your health care provider may want to do additional tests to assess your baby's well-being. Contact a health care provider if:  You feel fewer than 10 movements in 2 hours.  Your baby is not moving like he or she usually does. Date: ____________ Start time: ____________ Stop time: ____________ Movements: ____________ Date: ____________ Start time: ____________ Stop time: ____________ Movements: ____________ Date: ____________  Start time: ____________ Stop time: ____________ Movements: ____________ Date: ____________ Start time: ____________ Stop time: ____________ Movements: ____________ Date: ____________ Start time: ____________ Stop time: ____________ Movements: ____________ Date: ____________ Start time: ____________ Stop time: ____________ Movements: ____________ Date: ____________ Start time: ____________ Stop time: ____________ Movements: ____________ Date: ____________ Start time: ____________ Stop time: ____________ Movements: ____________ Date: ____________ Start time: ____________ Stop time: ____________ Movements: ____________ This information is not intended to replace advice given to you by your health care provider. Make sure you discuss any questions you have with your health care provider. Document Revised: 03/28/2019 Document Reviewed: 03/28/2019 Elsevier Patient Education  2020 Elsevier Inc. Braxton Hicks Contractions Contractions of the uterus can occur throughout pregnancy, but they are not always a sign that you are in labor. You may have practice contractions called Braxton Hicks contractions. These false labor contractions are sometimes confused with true labor. What are Braxton Hicks contractions? Braxton Hicks contractions are tightening movements that occur in the muscles of the uterus before labor. Unlike true labor contractions, these contractions do not result in opening (dilation) and thinning of the cervix. Toward the end of pregnancy (32-34 weeks), Braxton Hicks contractions can happen more often and may become stronger. These contractions are sometimes difficult to tell apart from true labor because they can be very uncomfortable. You should not feel embarrassed if you go to the hospital with false labor. Sometimes, the only way to tell if you are in true labor is for your health care provider to look for changes in the cervix. The health care provider   will do a physical exam and may  monitor your contractions. If you are not in true labor, the exam should show that your cervix is not dilating and your water has not broken. If there are no other health problems associated with your pregnancy, it is completely safe for you to be sent home with false labor. You may continue to have Braxton Hicks contractions until you go into true labor. How to tell the difference between true labor and false labor True labor  Contractions last 30-70 seconds.  Contractions become very regular.  Discomfort is usually felt in the top of the uterus, and it spreads to the lower abdomen and low back.  Contractions do not go away with walking.  Contractions usually become more intense and increase in frequency.  The cervix dilates and gets thinner. False labor  Contractions are usually shorter and not as strong as true labor contractions.  Contractions are usually irregular.  Contractions are often felt in the front of the lower abdomen and in the groin.  Contractions may go away when you walk around or change positions while lying down.  Contractions get weaker and are shorter-lasting as time goes on.  The cervix usually does not dilate or become thin. Follow these instructions at home:   Take over-the-counter and prescription medicines only as told by your health care provider.  Keep up with your usual exercises and follow other instructions from your health care provider.  Eat and drink lightly if you think you are going into labor.  If Braxton Hicks contractions are making you uncomfortable: ? Change your position from lying down or resting to walking, or change from walking to resting. ? Sit and rest in a tub of warm water. ? Drink enough fluid to keep your urine pale yellow. Dehydration may cause these contractions. ? Do slow and deep breathing several times an hour.  Keep all follow-up prenatal visits as told by your health care provider. This is important. Contact a  health care provider if:  You have a fever.  You have continuous pain in your abdomen. Get help right away if:  Your contractions become stronger, more regular, and closer together.  You have fluid leaking or gushing from your vagina.  You pass blood-tinged mucus (bloody show).  You have bleeding from your vagina.  You have low back pain that you never had before.  You feel your baby's head pushing down and causing pelvic pressure.  Your baby is not moving inside you as much as it used to. Summary  Contractions that occur before labor are called Braxton Hicks contractions, false labor, or practice contractions.  Braxton Hicks contractions are usually shorter, weaker, farther apart, and less regular than true labor contractions. True labor contractions usually become progressively stronger and regular, and they become more frequent.  Manage discomfort from Braxton Hicks contractions by changing position, resting in a warm bath, drinking plenty of water, or practicing deep breathing. This information is not intended to replace advice given to you by your health care provider. Make sure you discuss any questions you have with your health care provider. Document Revised: 07/21/2017 Document Reviewed: 12/22/2016 Elsevier Patient Education  2020 Elsevier Inc.  

## 2020-08-20 LAB — GC/CHLAMYDIA PROBE AMP (~~LOC~~) NOT AT ARMC
Chlamydia: NEGATIVE
Comment: NEGATIVE
Comment: NORMAL
Neisseria Gonorrhea: NEGATIVE

## 2020-08-22 LAB — CULTURE, BETA STREP (GROUP B ONLY): Strep Gp B Culture: POSITIVE — AB

## 2020-08-22 NOTE — L&D Delivery Note (Signed)
Delivery Note Called to room and patient complete and pushing. At 12:38 AM a viable female was delivered via Vaginal, Spontaneous (Presentation: LOA, with left compound hand).  APGAR: 8,9; weight 2930g. Placenta status: spontaneous, intact.  Cord: 3v  with the following complications: none. After delivery of placenta no lacerations were noted. Paragard IUD placed without difficulty, see procedure note for details. Pitocin and TXA were administered given continuous bleeding, however, fundus firm.  Anesthesia: None Episiotomy: none Lacerations: none Est. Blood Loss (mL): 150  Mom to postpartum.  Baby to Couplet care / Skin to Skin.  [name] 08/30/2020, 12:57 AM

## 2020-08-24 ENCOUNTER — Encounter: Payer: Self-pay | Admitting: Medical

## 2020-08-24 DIAGNOSIS — O98819 Other maternal infectious and parasitic diseases complicating pregnancy, unspecified trimester: Secondary | ICD-10-CM

## 2020-08-24 DIAGNOSIS — B951 Streptococcus, group B, as the cause of diseases classified elsewhere: Secondary | ICD-10-CM

## 2020-08-24 HISTORY — DX: Other maternal infectious and parasitic diseases complicating pregnancy, unspecified trimester: O98.819

## 2020-08-24 HISTORY — DX: Streptococcus, group b, as the cause of diseases classified elsewhere: B95.1

## 2020-08-26 ENCOUNTER — Other Ambulatory Visit: Payer: Medicaid Other

## 2020-08-26 ENCOUNTER — Other Ambulatory Visit: Payer: Self-pay

## 2020-08-26 ENCOUNTER — Ambulatory Visit (INDEPENDENT_AMBULATORY_CARE_PROVIDER_SITE_OTHER): Payer: Medicaid Other | Admitting: Obstetrics and Gynecology

## 2020-08-26 ENCOUNTER — Encounter: Payer: Self-pay | Admitting: Obstetrics and Gynecology

## 2020-08-26 VITALS — BP 110/75 | HR 84 | Wt 141.0 lb

## 2020-08-26 DIAGNOSIS — O0990 Supervision of high risk pregnancy, unspecified, unspecified trimester: Secondary | ICD-10-CM

## 2020-08-26 DIAGNOSIS — O98819 Other maternal infectious and parasitic diseases complicating pregnancy, unspecified trimester: Secondary | ICD-10-CM

## 2020-08-26 DIAGNOSIS — Z8759 Personal history of other complications of pregnancy, childbirth and the puerperium: Secondary | ICD-10-CM

## 2020-08-26 DIAGNOSIS — O09529 Supervision of elderly multigravida, unspecified trimester: Secondary | ICD-10-CM

## 2020-08-26 DIAGNOSIS — B951 Streptococcus, group B, as the cause of diseases classified elsewhere: Secondary | ICD-10-CM

## 2020-08-26 NOTE — Progress Notes (Signed)
Subjective:  [name] is a 35 y.o. [phone] at [redacted]w[redacted]d being seen today for ongoing prenatal care.  She is currently monitored for the following issues for this high-risk pregnancy and has Supervision of high risk pregnancy, antepartum; AMA (advanced maternal age) multigravida 35+, unspecified trimester; History of preterm premature rupture of membranes (PPROM); and Group B streptococcal infection during pregnancy on their problem list.  Patient reports no complaints.  Contractions: Not present. Vag. Bleeding: None.  Movement: Present. Denies leaking of fluid.   The following portions of the patient's history were reviewed and updated as appropriate: allergies, current medications, past family history, past medical history, past social history, past surgical history and problem list. Problem list updated.  Objective:   Vitals:   08/26/20 1349  BP: 110/75  Pulse: 84  Weight: 64 kg    Fetal Status: Fetal Heart Rate (bpm): 154   Movement: Present     General:  Alert, oriented and cooperative. Patient is in no acute distress.  Skin: Skin is warm and dry. No rash noted.   Cardiovascular: Normal heart rate noted  Respiratory: Normal respiratory effort, no problems with respiration noted  Abdomen: Soft, gravid, appropriate for gestational age. Pain/Pressure: Absent     Pelvic:  Cervical exam deferred        Extremities: Normal range of motion.  Edema: None  Mental Status: Normal mood and affect. Normal behavior. Normal judgment and thought content.   Urinalysis:      Assessment and Plan:  Pregnancy: [id number] at [redacted]w[redacted]d  1. Supervision of high risk pregnancy, antepartum Stable Labor precautions  2. Group B streptococcal infection during pregnancy Tx while in labor  3. AMA (advanced maternal age) multigravida 35+, unspecified trimester NIPs and AFP LR  4. History of preterm premature rupture of membranes (PPROM)   Term labor symptoms and general obstetric precautions including but  not limited to vaginal bleeding, contractions, leaking of fluid and fetal movement were reviewed in detail with the patient. Please refer to After Visit Summary for other counseling recommendations.  Return in about 1 week (around 09/02/2020) for OB visit, face to face, any provider.   Hermina Staggers, MD

## 2020-08-26 NOTE — Patient Instructions (Signed)

## 2020-08-27 ENCOUNTER — Telehealth: Payer: Self-pay | Admitting: Family Medicine

## 2020-08-27 ENCOUNTER — Other Ambulatory Visit: Payer: Medicaid Other

## 2020-08-27 DIAGNOSIS — O0990 Supervision of high risk pregnancy, unspecified, unspecified trimester: Secondary | ICD-10-CM

## 2020-08-27 MED ORDER — HYDROXYZINE PAMOATE 25 MG PO CAPS: 25.0000 mg | ORAL_CAPSULE | Freq: Three times a day (TID) | ORAL | 0 refills | Status: DC | PRN

## 2020-08-27 NOTE — Telephone Encounter (Signed)
Returned patients call. She reports she is having itching over her whole body. She reports the intense wide spread started last night. She had mild itching since Tuesday that increased greatly last night. She has not tried an antihistamine.   Vistaril sent to pharmacy per Dr. Hyacinth Meeker. Patient to come by the office this afternoon at 3 pm for labs.

## 2020-08-27 NOTE — Telephone Encounter (Signed)
Patient state she's been itching all over her body and not sure what's causing it

## 2020-08-29 ENCOUNTER — Encounter (HOSPITAL_COMMUNITY): Payer: Self-pay | Admitting: Obstetrics and Gynecology

## 2020-08-29 ENCOUNTER — Inpatient Hospital Stay (HOSPITAL_COMMUNITY)
Admission: AD | Admit: 2020-08-29 | Discharge: 2020-08-31 | DRG: 805 | Disposition: A | Discharge status: Home / Self Care | Payer: Medicaid Other | Attending: Obstetrics and Gynecology | Admitting: Obstetrics and Gynecology

## 2020-08-29 ENCOUNTER — Other Ambulatory Visit: Payer: Self-pay

## 2020-08-29 DIAGNOSIS — O26619 Liver and biliary tract disorders in pregnancy, unspecified trimester: Secondary | ICD-10-CM

## 2020-08-29 DIAGNOSIS — Z975 Presence of (intrauterine) contraceptive device: Secondary | ICD-10-CM

## 2020-08-29 DIAGNOSIS — U071 COVID-19: Secondary | ICD-10-CM | POA: Diagnosis present

## 2020-08-29 DIAGNOSIS — O322XX0 Maternal care for transverse and oblique lie, not applicable or unspecified: Secondary | ICD-10-CM | POA: Diagnosis present

## 2020-08-29 DIAGNOSIS — O9852 Other viral diseases complicating childbirth: Secondary | ICD-10-CM | POA: Diagnosis present

## 2020-08-29 DIAGNOSIS — Z8759 Personal history of other complications of pregnancy, childbirth and the puerperium: Secondary | ICD-10-CM

## 2020-08-29 DIAGNOSIS — Z3A37 37 weeks gestation of pregnancy: Secondary | ICD-10-CM

## 2020-08-29 DIAGNOSIS — O0990 Supervision of high risk pregnancy, unspecified, unspecified trimester: Secondary | ICD-10-CM

## 2020-08-29 DIAGNOSIS — O2662 Liver and biliary tract disorders in childbirth: Principal | ICD-10-CM | POA: Diagnosis present

## 2020-08-29 DIAGNOSIS — Z148 Genetic carrier of other disease: Secondary | ICD-10-CM

## 2020-08-29 DIAGNOSIS — Z30430 Encounter for insertion of intrauterine contraceptive device: Secondary | ICD-10-CM

## 2020-08-29 DIAGNOSIS — B951 Streptococcus, group B, as the cause of diseases classified elsewhere: Secondary | ICD-10-CM | POA: Diagnosis present

## 2020-08-29 DIAGNOSIS — O26649 Intrahepatic cholestasis of pregnancy, unspecified trimester: Secondary | ICD-10-CM | POA: Diagnosis present

## 2020-08-29 DIAGNOSIS — O09529 Supervision of elderly multigravida, unspecified trimester: Secondary | ICD-10-CM

## 2020-08-29 DIAGNOSIS — K831 Obstruction of bile duct: Secondary | ICD-10-CM

## 2020-08-29 DIAGNOSIS — O326XX0 Maternal care for compound presentation, not applicable or unspecified: Secondary | ICD-10-CM | POA: Diagnosis not present

## 2020-08-29 DIAGNOSIS — O99824 Streptococcus B carrier state complicating childbirth: Secondary | ICD-10-CM | POA: Diagnosis present

## 2020-08-29 DIAGNOSIS — O98819 Other maternal infectious and parasitic diseases complicating pregnancy, unspecified trimester: Secondary | ICD-10-CM | POA: Diagnosis present

## 2020-08-29 HISTORY — DX: Liver and biliary tract disorders in pregnancy, unspecified trimester: K83.1

## 2020-08-29 HISTORY — DX: Liver and biliary tract disorders in pregnancy, unspecified trimester: O26.619

## 2020-08-29 LAB — CBC
HCT: 35.2 % — ABNORMAL LOW (ref 36.0–46.0)
Hemoglobin: 10.7 g/dL — ABNORMAL LOW (ref 12.0–15.0)
MCH: 25.8 pg — ABNORMAL LOW (ref 26.0–34.0)
MCHC: 30.4 g/dL (ref 30.0–36.0)
MCV: 85 fL (ref 80.0–100.0)
Platelets: 200 10*3/uL (ref 150–400)
RBC: 4.14 MIL/uL (ref 3.87–5.11)
RDW: 15.9 % — ABNORMAL HIGH (ref 11.5–15.5)
WBC: 5.1 10*3/uL (ref 4.0–10.5)
nRBC: 1 % — ABNORMAL HIGH (ref 0.0–0.2)

## 2020-08-29 LAB — COMPREHENSIVE METABOLIC PANEL
ALT: 22 IU/L (ref 0–32)
AST: 35 IU/L (ref 0–40)
Albumin/Globulin Ratio: 1.2 (ref 1.2–2.2)
Albumin: 3.4 g/dL — ABNORMAL LOW (ref 3.8–4.8)
Alkaline Phosphatase: 328 IU/L — ABNORMAL HIGH (ref 44–121)
BUN/Creatinine Ratio: 11 (ref 9–23)
BUN: 10 mg/dL (ref 6–20)
Bilirubin Total: 0.3 mg/dL (ref 0.0–1.2)
CO2: 19 mmol/L — ABNORMAL LOW (ref 20–29)
Calcium: 8.5 mg/dL — ABNORMAL LOW (ref 8.7–10.2)
Chloride: 103 mmol/L (ref 96–106)
Creatinine, Ser: 0.91 mg/dL (ref 0.57–1.00)
GFR calc Af Amer: 94 mL/min/{1.73_m2} (ref >59)
GFR calc non Af Amer: 81 mL/min/{1.73_m2} (ref >59)
Globulin, Total: 2.9 g/dL (ref 1.5–4.5)
Glucose: 97 mg/dL (ref 65–99)
Potassium: 4.2 mmol/L (ref 3.5–5.2)
Sodium: 138 mmol/L (ref 134–144)
Total Protein: 6.3 g/dL (ref 6.0–8.5)

## 2020-08-29 LAB — BILE ACIDS, TOTAL: Bile Acids Total: 69.5 umol/L (ref 0.0–10.0)

## 2020-08-29 LAB — TYPE AND SCREEN
ABO/RH(D): O POS
Antibody Screen: NEGATIVE

## 2020-08-29 LAB — RESP PANEL BY RT-PCR (FLU A&B, COVID) ARPGX2
Influenza A by PCR: NEGATIVE
Influenza B by PCR: NEGATIVE
SARS Coronavirus 2 by RT PCR: POSITIVE — AB

## 2020-08-29 MED ORDER — OXYCODONE-ACETAMINOPHEN 5-325 MG PO TABS
2.0000 | ORAL_TABLET | ORAL | Status: DC | PRN
  Administered 2020-08-30: 2 via ORAL
  Filled 2020-08-29: qty 2

## 2020-08-29 MED ORDER — OXYTOCIN BOLUS FROM INFUSION
333.0000 mL | Freq: Once | INTRAVENOUS | Status: AC
  Administered 2020-08-30: 333 mL via INTRAVENOUS

## 2020-08-29 MED ORDER — ACETAMINOPHEN 325 MG PO TABS: 650.0000 mg | ORAL_TABLET | ORAL | Status: DC | PRN

## 2020-08-29 MED ORDER — SOD CITRATE-CITRIC ACID 500-334 MG/5ML PO SOLN: 30.0000 mL | ORAL | Status: DC | PRN

## 2020-08-29 MED ORDER — LACTATED RINGERS IV SOLN
INTRAVENOUS | Status: DC
  Administered 2020-08-29: 125 mL/h via INTRAVENOUS

## 2020-08-29 MED ORDER — TERBUTALINE SULFATE 1 MG/ML IJ SOLN: 0.2500 mg | Freq: Once | INTRAMUSCULAR | Status: DC | PRN

## 2020-08-29 MED ORDER — OXYTOCIN-SODIUM CHLORIDE 30-0.9 UT/500ML-% IV SOLN
2.5000 [IU]/h | INTRAVENOUS | Status: DC
  Administered 2020-08-30: 2.5 [IU]/h via INTRAVENOUS
  Filled 2020-08-29: qty 500

## 2020-08-29 MED ORDER — OXYTOCIN-SODIUM CHLORIDE 30-0.9 UT/500ML-% IV SOLN
1.0000 m[IU]/min | INTRAVENOUS | Status: DC
  Administered 2020-08-29: 2 m[IU]/min via INTRAVENOUS

## 2020-08-29 MED ORDER — ONDANSETRON HCL 4 MG/2ML IJ SOLN: 4.0000 mg | Freq: Four times a day (QID) | INTRAMUSCULAR | Status: DC | PRN

## 2020-08-29 MED ORDER — PARAGARD INTRAUTERINE COPPER IU IUD: INTRAUTERINE_SYSTEM | Freq: Once | INTRAUTERINE | Status: DC

## 2020-08-29 MED ORDER — FLEET ENEMA 7-19 GM/118ML RE ENEM: 1.0000 | ENEMA | RECTAL | Status: DC | PRN

## 2020-08-29 MED ORDER — SODIUM CHLORIDE 0.9 % IV SOLN
5.0000 10*6.[IU] | Freq: Once | INTRAVENOUS | Status: AC
  Administered 2020-08-29: 5 10*6.[IU] via INTRAVENOUS
  Filled 2020-08-29: qty 5

## 2020-08-29 MED ORDER — LACTATED RINGERS IV SOLN: 500.0000 mL | INTRAVENOUS | Status: DC | PRN

## 2020-08-29 MED ORDER — PARAGARD INTRAUTERINE COPPER IU IUD
INTRAUTERINE_SYSTEM | Freq: Once | INTRAUTERINE | Status: AC
  Administered 2020-08-30: 1 via INTRAUTERINE
  Filled 2020-08-29: qty 1

## 2020-08-29 MED ORDER — LIDOCAINE HCL (PF) 1 % IJ SOLN
30.0000 mL | INTRAMUSCULAR | Status: DC | PRN
Start: 2020-08-29 — End: 2020-08-30

## 2020-08-29 MED ORDER — OXYCODONE-ACETAMINOPHEN 5-325 MG PO TABS: 1.0000 | ORAL_TABLET | ORAL | Status: DC | PRN

## 2020-08-29 MED ORDER — PENICILLIN G POT IN DEXTROSE 60000 UNIT/ML IV SOLN
3.0000 10*6.[IU] | INTRAVENOUS | Status: DC
  Administered 2020-08-29 (×3): 3 10*6.[IU] via INTRAVENOUS
  Filled 2020-08-29 (×3): qty 50

## 2020-08-29 NOTE — H&P (Addendum)
OBSTETRIC ADMISSION HISTORY AND PHYSICAL  [name] is a 35 y.o. female [phone] with IUP at 23w6dby LMP presenting for IOL due to cholestasis. She reports +FMs, No LOF, no VB, no blurry vision, headaches or peripheral edema, and RUQ pain.  She plans on breast feeding. She requests post-placental paraguard IUD. [facility]  Dating: By LMP --->  Estimated Date of Delivery: 09/13/20  Sono:  _0 , CWD, normal anatomy, cephalic presentation,  3867Y 64% EFW   Prenatal History/Complications:  Cholestasis during pregnancy Advanced maternal age  Hx of PPROM GBS+ status COVID+ (diagnosed on admission, 1/8)  Past Medical History: History reviewed. No pertinent past medical history.  Past Surgical History: History reviewed. No pertinent surgical history.  Obstetrical History: OB History    Gravida  4   Para  3   Term  2   Preterm  1   AB  0   Living  3     SAB  0   IAB  0   Ectopic  0   Multiple  0   Live Births  3           Social History Social History   Socioeconomic History  . Marital status: Married    Spouse name: Not on file  . Number of children: Not on file  . Years of education: Not on file  . Highest education level: Not on file  Occupational History  . Not on file  Tobacco Use  . Smoking status: Never Smoker  . Smokeless tobacco: Never Used  Vaping Use  . Vaping Use: Never used  Substance and Sexual Activity  . Alcohol use: Never  . Drug use: Never  . Sexual activity: Yes    Birth control/protection: None  Other Topics Concern  . Not on file  Social History Narrative  . Not on file   Social Determinants of Health   Financial Resource Strain: Not on file  Food Insecurity: No Food Insecurity  . Worried About RCharity fundraiserin the Last Year: Never true  . Ran Out of Food in the Last Year: Never true  Transportation Needs: No Transportation Needs  . Lack of Transportation (Medical): No  . Lack of  Transportation (Non-Medical): No  Physical Activity: Not on file  Stress: Not on file  Social Connections: Not on file    Family History: Family History  Problem Relation Age of Onset  . Healthy Mother   . Healthy Father     Allergies: No Active Allergies  Pt denies allergies to latex, iodine, or shellfish.  Medications Prior to Admission  Medication Sig Dispense Refill Last Dose  . aspirin EC 81 MG tablet Take 1 tablet (81 mg total) by mouth daily. Take after 12 weeks for prevention of preeclampsia later in pregnancy 300 tablet 2   . Blood Pressure Monitoring (BLOOD PRESSURE KIT) DEVI 1 Device by Does not apply route daily. ICD 10: Z34.00 1 each 0   . ferrous gluconate (FERGON) 324 MG tablet Take 1 tablet (324 mg total) by mouth daily with breakfast. 90 tablet 0   . hydrOXYzine (VISTARIL) 25 MG capsule Take 1 capsule (25 mg total) by mouth 3 (three) times daily as needed for itching. 30 capsule 0   . Prenatal Vit-Fe Fumarate-FA (MULTIVITAMIN-PRENATAL) 27-0.8 MG TABS tablet Take 1 tablet by mouth daily at 12 noon.        Review of Systems   All systems reviewed and negative  except as stated in HPI  Blood pressure (!) 109/51, pulse 88, temperature (!) 96.6 F (35.9 C), temperature source Axillary, resp. rate 18, height _0  (1.575 m), weight 63.7 kg, last menstrual period 12/08/2019, SpO2 99 %. 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 Anatomy  US: normal  Prenatal Transfer Tool  Maternal Diabetes: No 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 Maternal Ultrasounds/Referrals: Normal Fetal Ultrasounds or other Referrals:  None Maternal Substance Abuse:  No Significant Maternal Medications:  None Significant Maternal Lab Results: Group B Strep positive  Results for orders placed or performed during the hospital encounter of 08/29/20 (from the past 24 hour(s))  CBC   Collection Time: 08/29/20 10:10 AM  Result Value Ref Range   WBC 5.1 4.0 - 10.5 K/uL   RBC 4.14 3.87 - 5.11 MIL/uL   Hemoglobin 10.7 (L) 12.0 - 15.0 g/dL   HCT 35.2 (L) 36.0 - 46.0 %   MCV 85.0 80.0 - 100.0 fL   MCH 25.8 (L) 26.0 - 34.0 pg   MCHC 30.4 30.0 - 36.0 g/dL   RDW 15.9 (H) 11.5 - 15.5 %   Platelets 200 150 - 400 K/uL   nRBC 1.0 (H) 0.0 - 0.2 %  Type and screen [place]   Collection Time: 08/29/20 10:10 AM  Result Value Ref Range   ABO/RH(D) PENDING    Antibody Screen PENDING    Sample Expiration      09/01/2020,2359 Performed at [facility], [address] [place], [place] [address]     Patient Active Problem List   Diagnosis Date Noted  . Cholestasis during pregnancy 08/29/2020  . Group B streptococcal infection during pregnancy 08/24/2020  . Supervision of high risk pregnancy, antepartum 04/20/2020  . AMA (advanced maternal age) multigravida 87+, unspecified trimester 04/20/2020  . History of preterm premature rupture of membranes (PPROM) 04/20/2020    Assessment/Plan:  [name] is a 35 y.o. [phone] at 34w6dhere for IOL due to cholestasis.  #Labor: Plan for Cytotec and FB on admission. Will transition to pitocin as clinically indicated. #Pain: Analgesics prn. Pt declined epidural on admission. #FWB:  Category 1 #ID:  GBS+. Will treat with PCN.  #MOF: Breast #MOC: desires post-placental Paraguard IUD. Consented and ordered on admission. #Circ:  N/A  #Hx of PPROM: pt not on makena this  pregnancy (declined). #cholestasis: BA 69.5 on 08/27/20. IOL as noted above.  VOrganfor [employer] 08/29/2020, 10:56 AM  Attestation of Supervision of Student:  I confirm that I have verified the information documented in the resident's note and that I have also personally reperformed the history, physical exam and all medical decision making activities.  I have verified that all services and findings are accurately documented in this student's note; and I agree with management and plan as outlined in the documentation. I have also made any necessary editorial changes.  ARanda Ngo MTallasseefor [facility] CRamsey[facility] 08/29/2020 5:23  PM   

## 2020-08-29 NOTE — MAU Provider Note (Signed)
S Ms. Otie Headlee is a 35 y.o. [phone] patient who presents to MAU today for direct admission to labor and delivery for IOL for cholestasis.   O BP 108/62 (BP Location: Right Arm)   Pulse (!) 107   Temp 98.1 F (36.7 C) (Oral)   Resp 16   Ht 5\' 2"  (1.575 m)   Wt 63.7 kg   LMP 12/08/2019 (Exact Date)   SpO2 99% Comment: room air  BMI 25.70 kg/m  Physical Exam Vitals and nursing note reviewed.  Constitutional:      General: She is not in acute distress.    Appearance: She is well-developed and well-nourished.  HENT:     Head: Normocephalic.  Eyes:     Pupils: Pupils are equal, round, and reactive to light.  Cardiovascular:     Rate and Rhythm: Normal rate and regular rhythm.     Heart sounds: Normal heart sounds.  Pulmonary:     Effort: Pulmonary effort is normal. No respiratory distress.     Breath sounds: Normal breath sounds.  Abdominal:     General: Bowel sounds are normal. There is no distension.     Palpations: Abdomen is soft.     Tenderness: There is no abdominal tenderness.  Skin:    General: Skin is warm and dry.  Neurological:     Mental Status: She is alert and oriented to person, place, and time.  Psychiatric:        Mood and Affect: Mood and affect normal.        Behavior: Behavior normal.        Thought Content: Thought content normal.        Judgment: Judgment normal.     A Medical screening exam complete   P Admit to labor and delivery for induction  12/10/2019 08/29/2020 9:34 AM

## 2020-08-29 NOTE — Progress Notes (Signed)
Labor Progress Note [name] is a 34 y.o. [phone] at [redacted]w[redacted]d presented for IOL-cholestasis. S: Doing well without complaints.  O:  BP 113/61   Pulse 68   Temp 98.1 F (36.7 C) (Oral)   Resp 18   Ht 5\' 2"  (1.575 m)   Wt 63.7 kg   LMP 12/08/2019 (Exact Date)   SpO2 100%   BMI 25.70 kg/m  EFM: baseline 140bpm/mod variability/+ accels/no decels Toco: q1-3 min  CVE: Dilation: 4.5 Effacement (%): 70 Cervical Position: Posterior Station: -2 Presentation: Vertex Exam by:: Polos RN   A&P: 35 y.o. 31 [redacted]w[redacted]d presented for IOL-cholestasis. #IOL: S/p FB. Pit started @1330 . Risks/benefits of AROM discussed with patient and performed with this exam, given minimal cervical change since last check and head well engaged. IUPC placed as well. Continue to titrate. #Pain: PRN #FWB: cat 1 #GBS positive, PCN, adequate prophylaxis #Contraception: discussed risks/benefits of paragard. Patient will sign consent form. Previously ordered.  [redacted]w[redacted]d, MD 11:03 PM

## 2020-08-29 NOTE — MAU Note (Signed)
[name] is a 35 y.o. at [redacted]w[redacted]d here in MAU reporting: pt reports she was called in for induction due to elevated bile acids. No pain, no LOF, or bleeding. +FM  Pain score: 0/10  Vitals:   08/29/20 0929  BP: 108/62  Pulse: (!) 107  Resp: 16  Temp: 98.1 F (36.7 C)  SpO2: 99%     FHT: +FM  Lab orders placed from triage: none

## 2020-08-30 ENCOUNTER — Encounter (HOSPITAL_COMMUNITY): Payer: Self-pay | Admitting: Obstetrics and Gynecology

## 2020-08-30 DIAGNOSIS — Z30430 Encounter for insertion of intrauterine contraceptive device: Secondary | ICD-10-CM

## 2020-08-30 DIAGNOSIS — Z3A37 37 weeks gestation of pregnancy: Secondary | ICD-10-CM

## 2020-08-30 DIAGNOSIS — Z148 Genetic carrier of other disease: Secondary | ICD-10-CM

## 2020-08-30 DIAGNOSIS — U071 COVID-19: Secondary | ICD-10-CM | POA: Diagnosis present

## 2020-08-30 DIAGNOSIS — O326XX0 Maternal care for compound presentation, not applicable or unspecified: Secondary | ICD-10-CM

## 2020-08-30 DIAGNOSIS — Z975 Presence of (intrauterine) contraceptive device: Secondary | ICD-10-CM

## 2020-08-30 HISTORY — DX: COVID-19: U07.1

## 2020-08-30 HISTORY — DX: Genetic carrier of other disease: Z14.8

## 2020-08-30 LAB — RPR: RPR Ser Ql: NONREACTIVE

## 2020-08-30 MED ORDER — SIMETHICONE 80 MG PO CHEW: 80.0000 mg | CHEWABLE_TABLET | ORAL | Status: DC | PRN

## 2020-08-30 MED ORDER — PRENATAL MULTIVITAMIN CH
1.0000 | ORAL_TABLET | Freq: Every day | ORAL | Status: DC
  Administered 2020-08-30: 1 via ORAL
  Filled 2020-08-30: qty 1

## 2020-08-30 MED ORDER — MEASLES, MUMPS & RUBELLA VAC IJ SOLR: 0.5000 mL | Freq: Once | INTRAMUSCULAR | Status: DC

## 2020-08-30 MED ORDER — DIBUCAINE (PERIANAL) 1 % EX OINT: 1.0000 "application " | TOPICAL_OINTMENT | CUTANEOUS | Status: DC | PRN

## 2020-08-30 MED ORDER — WITCH HAZEL-GLYCERIN EX PADS: 1.0000 "application " | MEDICATED_PAD | CUTANEOUS | Status: DC | PRN

## 2020-08-30 MED ORDER — COCONUT OIL OIL: 1.0000 "application " | TOPICAL_OIL | Status: DC | PRN

## 2020-08-30 MED ORDER — ONDANSETRON HCL 4 MG/2ML IJ SOLN: 4.0000 mg | INTRAMUSCULAR | Status: DC | PRN

## 2020-08-30 MED ORDER — BENZOCAINE-MENTHOL 20-0.5 % EX AERO: 1.0000 "application " | INHALATION_SPRAY | CUTANEOUS | Status: DC | PRN

## 2020-08-30 MED ORDER — TRANEXAMIC ACID-NACL 1000-0.7 MG/100ML-% IV SOLN: 1000.0000 mg | INTRAVENOUS | Status: AC

## 2020-08-30 MED ORDER — TRANEXAMIC ACID-NACL 1000-0.7 MG/100ML-% IV SOLN
INTRAVENOUS | Status: AC
  Administered 2020-08-30: 1000 mg via INTRAVENOUS
  Filled 2020-08-30: qty 100

## 2020-08-30 MED ORDER — SENNOSIDES-DOCUSATE SODIUM 8.6-50 MG PO TABS
2.0000 | ORAL_TABLET | ORAL | Status: DC
  Administered 2020-08-30 – 2020-08-31 (×2): 2 via ORAL
  Filled 2020-08-30 (×2): qty 2

## 2020-08-30 MED ORDER — DIPHENHYDRAMINE HCL 25 MG PO CAPS: 25.0000 mg | ORAL_CAPSULE | Freq: Four times a day (QID) | ORAL | Status: DC | PRN

## 2020-08-30 MED ORDER — ONDANSETRON HCL 4 MG PO TABS: 4.0000 mg | ORAL_TABLET | ORAL | Status: DC | PRN

## 2020-08-30 MED ORDER — TETANUS-DIPHTH-ACELL PERTUSSIS 5-2.5-18.5 LF-MCG/0.5 IM SUSY: 0.5000 mL | PREFILLED_SYRINGE | Freq: Once | INTRAMUSCULAR | Status: DC

## 2020-08-30 MED ORDER — ACETAMINOPHEN 325 MG PO TABS: 650.0000 mg | ORAL_TABLET | ORAL | Status: DC | PRN

## 2020-08-30 MED ORDER — IBUPROFEN 600 MG PO TABS
600.0000 mg | ORAL_TABLET | Freq: Four times a day (QID) | ORAL | Status: DC
  Administered 2020-08-30 – 2020-08-31 (×5): 600 mg via ORAL
  Filled 2020-08-30 (×5): qty 1

## 2020-08-30 NOTE — Lactation Note (Signed)
Lactation Consultation Note  Patient Name: [name] WYSHU'O Date: 08/30/2020   Age:35 y.o.  Maternal Data    Feeding    LATCH Score                   Interventions    Lactation Tools Discussed/Used     Consult Status      [name] 08/30/2020, 2:50 AM

## 2020-08-30 NOTE — Discharge Summary (Signed)
Postpartum Discharge Summary  Date of Service updated 08/31/20     Patient Name: [name] DOB: [birth year] MRN: [id number]  Date of admission: 08/29/2020 Delivery date:08/30/2020  Delivering provider: Arrie Senate  Date of discharge: 08/31/2020  Admitting diagnosis: Cholestasis during pregnancy [O26.619, K83.1] Intrauterine pregnancy: [redacted]w[redacted]d    Secondary diagnosis:  Active Problems:   Supervision of high risk pregnancy, antepartum   AMA (advanced maternal age) multigravida 35+, unspecified trimester   History of preterm premature rupture of membranes (PPROM)   Group B streptococcal infection during pregnancy   Cholestasis during pregnancy   Vaginal delivery   IUD (intrauterine device) in place   COVID-19   Genetic carrier  Additional problems: none    Discharge diagnosis: Term Pregnancy Delivered                                              Post partum procedures:post placental IUD placed Augmentation: AROM, Pitocin and IP Foley Complications: None  Hospital course: Induction of Labor With Vaginal Delivery   35y.o. yo [id number] admitted to the hospital 08/29/2020 for induction of labor.  Indication for induction: Cholestasis of pregnancy.  Patient had an uncomplicated labor course as follows: Membrane Rupture Time/Date: 11:00 PM ,08/29/2020   Delivery Method:Vaginal, Spontaneous  Episiotomy: None  Lacerations:  None  Details of delivery can be found in separate delivery note.  Patient had a routine postpartum course. Patient is discharged home 08/31/20.  Newborn Data: Birth date:08/30/2020  Birth time:12:38 AM  Gender:Female  Living status:Living  Apgars:8 ,9  Weight:2930 g   Magnesium Sulfate received: No BMZ received: No Rhophylac:N/A MMR:N/A T-DaP:Given prenatally Flu: No Transfusion:No  Physical exam  Vitals:   08/30/20 1325 08/30/20 1635 08/30/20 2015 08/31/20 0510  BP: 108/73 103/61 102/65 109/71  Pulse: 78 84 83 64  Resp: '18 18 18 18   ' Temp: 97.8 F (36.6 C) 97.8 F (36.6 C) 98.1 F (36.7 C) 97.6 F (36.4 C)  TempSrc: Oral Oral Oral Oral  SpO2: 100% 100% 100% 100%  Weight:      Height:       General: alert, cooperative and no distress Lochia: appropriate Uterine Fundus: firm Incision: N/A DVT Evaluation: No evidence of DVT seen on physical exam. Labs: Lab Results  Component Value Date   WBC 5.1 08/29/2020   HGB 10.7 (L) 08/29/2020   HCT 35.2 (L) 08/29/2020   MCV 85.0 08/29/2020   PLT 200 08/29/2020   CMP Latest Ref Rng & Units 08/27/2020  Glucose 65 - 99 mg/dL 97  BUN 6 - 20 mg/dL 10  Creatinine 0.57 - 1.00 mg/dL 0.91  Sodium 134 - 144 mmol/L 138  Potassium 3.5 - 5.2 mmol/L 4.2  Chloride 96 - 106 mmol/L 103  CO2 20 - 29 mmol/L 19(L)  Calcium 8.7 - 10.2 mg/dL 8.5(L)  Total Protein 6.0 - 8.5 g/dL 6.3  Total Bilirubin 0.0 - 1.2 mg/dL 0.3  Alkaline Phos 44 - 121 IU/L 328(H)  AST 0 - 40 IU/L 35  ALT 0 - 32 IU/L 22   Edinburgh Score: Edinburgh Postnatal Depression Scale Screening Tool 08/30/2020  I have been able to laugh and see the funny side of things. 0  I have looked forward with enjoyment to things. 0  I have blamed myself unnecessarily when things went wrong. 0  I have  been anxious or worried for no good reason. 0  I have felt scared or panicky for no good reason. 0  Things have been getting on top of me. 0  I have been so unhappy that I have had difficulty sleeping. 0  I have felt sad or miserable. 0  I have been so unhappy that I have been crying. 0  The thought of harming myself has occurred to me. 0  Edinburgh Postnatal Depression Scale Total 0     After visit meds:  Allergies as of 08/31/2020   No Active Allergies     Medication List    STOP taking these medications   aspirin EC 81 MG tablet   ferrous gluconate 324 MG tablet Commonly known as: FERGON   hydrOXYzine 25 MG capsule Commonly known as: Vistaril     TAKE these medications   acetaminophen 500 MG tablet Commonly  known as: TYLENOL Take 2 tablets (1,000 mg total) by mouth every 6 (six) hours as needed (for pain scale < 4).   Blood Pressure Kit Devi 1 Device by Does not apply route daily. ICD 10: Z34.00   coconut oil Oil Apply 1 application topically as needed.   ibuprofen 600 MG tablet Commonly known as: ADVIL Take 1 tablet (600 mg total) by mouth every 6 (six) hours as needed.   multivitamin-prenatal 27-0.8 MG Tabs tablet Take 1 tablet by mouth daily at 12 noon.        Discharge home in stable condition Infant Feeding: Bottle and Breast Infant Disposition:home with mother Discharge instruction: per After Visit Summary and Postpartum booklet. Activity: Advance as tolerated. Pelvic rest for 6 weeks.  Diet: routine diet Future Appointments: Future Appointments  Date Time Provider Bell  09/02/2020  1:40 PM Tresea Mall, CNM [facility]   Follow up Visit: Message sent to [facility] by Sylvester Harder 08/30/20.   Please schedule this patient for a In person postpartum visit in 4 weeks with the following provider: Any provider. Additional Postpartum F/U:none  High risk pregnancy complicated by: cholestasis of pregnancy Delivery mode:  Vaginal, Spontaneous  Anticipated Birth Control:  PP IUD placed, perform string check at pp visit   4/49/6759 Arrie Senate, MD

## 2020-08-30 NOTE — Lactation Note (Signed)
This note was copied from a baby's chart. Lactation Consultation Note  Patient Name: [name] [id number] Date: 08/30/2020 Reason for consult: Initial assessment;Early term 37-38.6wks Age:37 hours  Covid + patient who was not seen in labor and delivery  P4 mother whose infant is now 59 hours old.  This is an ETI at 38+0 weeks.  Mother breast fed her first two children ( now 16 and 61 years old) for two years and her third child (now 42 years old) for 6 months.  Mother's feeding preference is breast/bottle.  Baby was asleep laying next to mother when I arrived.  Reviewed breast feeding basics with mother.  She is familiar with hand expression and feeding cues.  Colostrum container provided and milk storage times reviewed.  Finger feeding demonstrated.  Mother will feed 8-12 times/24 hours or sooner if baby shows feeding cues.  Encouraged to call her RN/LC for questions or latch assistance as needed.  Mother does not have a DEBP for home use.  She is a [facility] participant but is choosing the formula package.  Mother denies the need for a DEBP.  Father present.     Maternal Data Formula Feeding for Exclusion: Yes Reason for exclusion: Mother's choice to formula and breast feed on admission Has patient been taught Hand Expression?: Yes Does the patient have breastfeeding experience prior to this delivery?: Yes  Feeding Feeding Type: Breast Fed  LATCH Score Latch: Repeated attempts needed to sustain latch, nipple held in mouth throughout feeding, stimulation needed to elicit sucking reflex.  Audible Swallowing: A few with stimulation  Type of Nipple: Flat  Comfort (Breast/Nipple): Soft / non-tender  Hold (Positioning): Assistance needed to correctly position infant at breast and maintain latch.  LATCH Score: 6  Interventions    Lactation Tools Discussed/Used WIC Program: Yes   Consult Status Consult Status: Follow-up Date: 08/31/20 Follow-up type: In-patient    Malvern Kadlec R  Tish Begin 08/30/2020, 7:58 AM

## 2020-08-30 NOTE — Lactation Note (Signed)
This note was copied from a baby's chart. Lactation Consultation Note  Patient Name: [name] CWCBJ'S Date: 08/30/2020   Age:37 hours LC attempted to see mom and baby in L&D. Mother and baby will be seen by [facility] services on MBU.  (No charge for [facility] services). Maternal Data    Feeding    LATCH Score                   Interventions    Lactation Tools Discussed/Used     Consult Status      [name] 08/30/2020, 2:51 AM

## 2020-08-30 NOTE — Procedures (Signed)
  Post-Placental IUD Insertion Procedure Note  Patient identified, informed consent signed prior to delivery, signed copy in chart, time out was performed.    Vaginal, labial and perineal areas thoroughly inspected for lacerations. No laceration identified.  Paragard  - IUD inserted with inserter per manufacturer's instructions.    Patient tolerated procedure well.  Patient given post procedure instructions and IUD care card with expiration date.  Patient is asked to keep IUD strings tucked in her vagina until her postpartum follow up visit in 4-6 weeks. Patient advised to abstain from sexual intercourse and pulling on strings before her follow-up visit. Patient verbalized an understanding of the plan of care and agrees.   Alric Seton, MD OB Fellow, Faculty [facility], [facility] for [facility] 08/30/2020 12:56 AM

## 2020-08-31 MED ORDER — COCONUT OIL OIL: 1.0000 "application " | TOPICAL_OIL | 0 refills | Status: AC | PRN

## 2020-08-31 MED ORDER — ACETAMINOPHEN 500 MG PO TABS: 1000.0000 mg | ORAL_TABLET | Freq: Four times a day (QID) | ORAL | Status: AC | PRN

## 2020-08-31 MED ORDER — IBUPROFEN 600 MG PO TABS: 600.0000 mg | ORAL_TABLET | Freq: Four times a day (QID) | ORAL | 0 refills | Status: AC | PRN

## 2020-08-31 NOTE — Discharge Instructions (Signed)
-take tylenol 1000 mg every 6 hours as needed for pain, alternate with ibuprofen 600 mg every 6 hours -drink plenty of water to help with breastfeeding -continue prenatal vitamins while you are breastfeeding   Intrauterine Device Information An intrauterine device (IUD) is a medical device that is inserted in the uterus to prevent pregnancy. It is a small, T-shaped device that has one or two nylon strings hanging down from it. The strings hang out of the lower part of the uterus (cervix) to allow for future IUD removal. There are two types of IUDs available:  Hormone IUD. This type of IUD is made of plastic and contains the hormone progestin (synthetic progesterone). A hormone IUD may last 3-5 years.  Copper IUD. This type of IUD has copper wire wrapped around it. A copper IUD may last up to 10 years. How is an IUD inserted? An IUD is inserted through the vagina and placed into the uterus with a minor medical procedure. The exact procedure for IUD insertion may vary among health care providers and hospitals. How does an IUD work? Synthetic progesterone in a hormonal IUD prevents pregnancy by:  Thickening cervical mucus to prevent sperm from entering the uterus.  Thinning the uterine lining to prevent a fertilized egg from being implanted there. Copper in a copper IUD prevents pregnancy by making the uterus and fallopian tubes produce a fluid that kills sperm. What are the advantages of an IUD? Advantages of either type of IUD  It is highly effective in preventing pregnancy.  It is reversible. You can become pregnant shortly after the IUD is removed.  It is low-maintenance and can stay in place for a long time.  There are no estrogen-related side effects.  It can be used when breastfeeding.  It is not associated with weight gain.  It can be inserted right after childbirth, an abortion, or a miscarriage. Advantages of a hormone IUD  If it is inserted within 7 days of your period  starting, it works right after it is inserted. If the hormone IUD is inserted at any other time in your cycle, you will need to use a backup method of birth control for 7 days after insertion.  It can make menstrual periods lighter.  It can reduce menstrual cramping.  It can be used for 3-5 years. Advantages of a copper IUD  It works right after it is inserted.  It can be used as a form of emergency birth control if it is inserted within 5 days after having unprotected sex.  It does not interfere with your body's natural hormones.  It can be used for 10 years. What are the disadvantages of an IUD?  An IUD may cause irregular menstrual bleeding for a period of time after insertion.  You may have pain during insertion and have cramping and vaginal bleeding after insertion.  An IUD may cut the uterus (uterine perforation) when it is inserted. This is rare.  An IUD may cause pelvic inflammatory disease (PID), which is an infection in the uterus and fallopian tubes. This is rare, and it usually happens during the first 20 days after the IUD is inserted.  A copper IUD can make your menstrual flow heavier and more painful. How is an IUD removed?  You will lie on your back with your knees bent and your feet in footrests (stirrups).  A device will be inserted into your vagina to spread apart the vaginal walls (speculum). This will allow your health care provider to  see the strings attached to the IUD.  Your health care provider will use a small instrument (forceps) to grasp the IUD strings and pull firmly until the IUD is removed. You may have some discomfort when the IUD is removed. Your health care provider may recommend taking over-the-counter pain relievers, such as ibuprofen, before the procedure. You may also have minor spotting for a few days after the procedure. The exact procedure for IUD removal may vary among health care providers and hospitals. Is the IUD right for me? Your  health care provider will make sure you are a good candidate for an IUD and will discuss the advantages, disadvantages, and possible side effects with you. Summary  An intrauterine device (IUD) is a medical device that is inserted in the uterus to prevent pregnancy. It is a small, T-shaped device that has one or two nylon strings hanging down from it.  A hormone IUD contains the hormone progestin (synthetic progesterone). A copper IUD has copper wire wrapped around it.  Synthetic progesterone in a hormone IUD prevents pregnancy by thickening cervical mucus and thinning the walls of the uterus. Copper in a copper IUD prevents pregnancy by making the uterus and fallopian tubes produce a fluid that kills sperm.  A hormone IUD can be left in place for 3-5 years. A copper IUD can be left in place for up to 10 years.  An IUD is inserted and removed by a health care provider. You may feel some pain during insertion and removal. Your health care provider may recommend taking over-the-counter pain medicine, such as ibuprofen, before an IUD procedure. This information is not intended to replace advice given to you by your health care provider. Make sure you discuss any questions you have with your health care provider. Document Revised: 07/21/2017 Document Reviewed: 09/06/2016 Elsevier Patient Education  2020 Elsevier Inc. Postpartum Care After Vaginal Delivery This sheet gives you information about how to care for yourself from the time you deliver your baby to up to 6-12 weeks after delivery (postpartum period). Your health care provider may also give you more specific instructions. If you have problems or questions, contact your health care provider. Follow these instructions at home: Vaginal bleeding  It is normal to have vaginal bleeding (lochia) after delivery. Wear a sanitary pad for vaginal bleeding and discharge. ? During the first week after delivery, the amount and appearance of lochia is  often similar to a menstrual period. ? Over the next few weeks, it will gradually decrease to a dry, yellow-brown discharge. ? For most women, lochia stops completely by 4-6 weeks after delivery. Vaginal bleeding can vary from woman to woman.  Change your sanitary pads frequently. Watch for any changes in your flow, such as: ? A sudden increase in volume. ? A change in color. ? Large blood clots.  If you pass a blood clot from your vagina, save it and call your health care provider to discuss. Do not flush blood clots down the toilet before talking with your health care provider.  Do not use tampons or douches until your health care provider says this is safe.  If you are not breastfeeding, your period should return 6-8 weeks after delivery. If you are feeding your child breast milk only (exclusive breastfeeding), your period may not return until you stop breastfeeding. Perineal care  Keep the area between the vagina and the anus (perineum) clean and dry as told by your health care provider. Use medicated pads and pain-relieving sprays and  creams as directed.  If you had a cut in the perineum (episiotomy) or a tear in the vagina, check the area for signs of infection until you are healed. Check for: ? More redness, swelling, or pain. ? Fluid or blood coming from the cut or tear. ? Warmth. ? Pus or a bad smell.  You may be given a squirt bottle to use instead of wiping to clean the perineum area after you go to the bathroom. As you start healing, you may use the squirt bottle before wiping yourself. Make sure to wipe gently.  To relieve pain caused by an episiotomy, a tear in the vagina, or swollen veins in the anus (hemorrhoids), try taking a warm sitz bath 2-3 times a day. A sitz bath is a warm water bath that is taken while you are sitting down. The water should only come up to your hips and should cover your buttocks. Breast care  Within the first few days after delivery, your breasts  may feel heavy, full, and uncomfortable (breast engorgement). Milk may also leak from your breasts. Your health care provider can suggest ways to help relieve the discomfort. Breast engorgement should go away within a few days.  If you are breastfeeding: ? Wear a bra that supports your breasts and fits you well. ? Keep your nipples clean and dry. Apply creams and ointments as told by your health care provider. ? You may need to use breast pads to absorb milk that leaks from your breasts. ? You may have uterine contractions every time you breastfeed for up to several weeks after delivery. Uterine contractions help your uterus return to its normal size. ? If you have any problems with breastfeeding, work with your health care provider or Advertising copywriter.  If you are not breastfeeding: ? Avoid touching your breasts a lot. Doing this can make your breasts produce more milk. ? Wear a good-fitting bra and use cold packs to help with swelling. ? Do not squeeze out (express) milk. This causes you to make more milk. Intimacy and sexuality  Ask your health care provider when you can engage in sexual activity. This may depend on: ? Your risk of infection. ? How fast you are healing. ? Your comfort and desire to engage in sexual activity.  You are able to get pregnant after delivery, even if you have not had your period. If desired, talk with your health care provider about methods of birth control (contraception). Medicines  Take over-the-counter and prescription medicines only as told by your health care provider.  If you were prescribed an antibiotic medicine, take it as told by your health care provider. Do not stop taking the antibiotic even if you start to feel better. Activity  Gradually return to your normal activities as told by your health care provider. Ask your health care provider what activities are safe for you.  Rest as much as possible. Try to rest or take a nap while your  baby is sleeping. Eating and drinking   Drink enough fluid to keep your urine pale yellow.  Eat high-fiber foods every day. These may help prevent or relieve constipation. High-fiber foods include: ? Whole grain cereals and breads. ? Brown rice. ? Beans. ? Fresh fruits and vegetables.  Do not try to lose weight quickly by cutting back on calories.  Take your prenatal vitamins until your postpartum checkup or until your health care provider tells you it is okay to stop. Lifestyle  Do not use any  products that contain nicotine or tobacco, such as cigarettes and e-cigarettes. If you need help quitting, ask your health care provider.  Do not drink alcohol, especially if you are breastfeeding. General instructions  Keep all follow-up visits for you and your baby as told by your health care provider. Most women visit their health care provider for a postpartum checkup within the first 3-6 weeks after delivery. Contact a health care provider if:  You feel unable to cope with the changes that your child brings to your life, and these feelings do not go away.  You feel unusually sad or worried.  Your breasts become red, painful, or hard.  You have a fever.  You have trouble holding urine or keeping urine from leaking.  You have little or no interest in activities you used to enjoy.  You have not breastfed at all and you have not had a menstrual period for 12 weeks after delivery.  You have stopped breastfeeding and you have not had a menstrual period for 12 weeks after you stopped breastfeeding.  You have questions about caring for yourself or your baby.  You pass a blood clot from your vagina. Get help right away if:  You have chest pain.  You have difficulty breathing.  You have sudden, severe leg pain.  You have severe pain or cramping in your lower abdomen.  You bleed from your vagina so much that you fill more than one sanitary pad in one hour. Bleeding should not be  heavier than your heaviest period.  You develop a severe headache.  You faint.  You have blurred vision or spots in your vision.  You have bad-smelling vaginal discharge.  You have thoughts about hurting yourself or your baby. If you ever feel like you may hurt yourself or others, or have thoughts about taking your own life, get help right away. You can go to the nearest emergency department or call:  Your local emergency services (911 in the [place]  A suicide crisis helpline, such as the National Suicide Prevention Lifeline at 80459723371-5850521213. This is open 24 hours a day. Summary  The period of time right after you deliver your newborn up to 6-12 weeks after delivery is called the postpartum period.  Gradually return to your normal activities as told by your health care provider.  Keep all follow-up visits for you and your baby as told by your health care provider. This information is not intended to replace advice given to you by your health care provider. Make sure you discuss any questions you have with your health care provider. Document Revised: 08/11/2017 Document Reviewed: 05/22/2017 Elsevier Patient Education  2020 [employer]

## 2020-08-31 NOTE — Lactation Note (Incomplete)
This note was copied from a baby's chart. Lactation Consultation Note  Patient Name: [name] [id number] Date: 08/31/2020   Age:37 hours  Maternal Data    Feeding Feeding Type: Breast Fed Nipple Type: Slow - flow  LATCH Score                   Interventions    Lactation Tools Discussed/Used     Consult Status      [name] [facility] 08/31/2020, 11:04 AM

## 2020-08-31 NOTE — Social Work (Signed)
CSW Montel Clock verbally consulted to assist MOB with Medicaid questions. CSW contacted financial navigator to follow-up if able. CSW [place] provided MOB with information on where to sign-up for Medicaid.   CSW identifies no further need for intervention at this time.  Manfred Arch, MSW, [employer] Clinical Social Work [employer] and [employer] [phone]

## 2020-08-31 NOTE — Progress Notes (Signed)
CSW received call from RN at pt's request regarding Medicaid. CSW reached out to [place] with [employer] and left voicemail for her to call CSW back regarding pt. CSW also spoke with CSW on [employer] who will give  MOB information in the event that call is not returned by [facility].    Claude Manges Daion Ginsberg, MSW, LCSW [facility] [phone]

## 2020-09-02 ENCOUNTER — Encounter: Payer: Medicaid Other | Admitting: Advanced Practice Midwife

## 2020-10-05 ENCOUNTER — Ambulatory Visit (INDEPENDENT_AMBULATORY_CARE_PROVIDER_SITE_OTHER): Payer: Medicaid Other | Admitting: Obstetrics and Gynecology

## 2020-10-05 ENCOUNTER — Other Ambulatory Visit: Payer: Self-pay

## 2020-10-05 ENCOUNTER — Encounter: Payer: Self-pay | Admitting: Obstetrics and Gynecology

## 2020-10-05 VITALS — BP 121/69 | HR 73 | Ht 61.0 in | Wt 130.5 lb

## 2020-10-05 DIAGNOSIS — Z975 Presence of (intrauterine) contraceptive device: Secondary | ICD-10-CM

## 2020-10-05 NOTE — Patient Instructions (Signed)
Health Maintenance, Female Adopting a healthy lifestyle and getting preventive care are important in promoting health and wellness. Ask your health care provider about:  The right schedule for you to have regular tests and exams.  Things you can do on your own to prevent diseases and keep yourself healthy. What should I know about diet, weight, and exercise? Eat a healthy diet  Eat a diet that includes plenty of vegetables, fruits, low-fat dairy products, and lean protein.  Do not eat a lot of foods that are high in solid fats, added sugars, or sodium.   Maintain a healthy weight Body mass index (BMI) is used to identify weight problems. It estimates body fat based on height and weight. Your health care provider can help determine your BMI and help you achieve or maintain a healthy weight. Get regular exercise Get regular exercise. This is one of the most important things you can do for your health. Most adults should:  Exercise for at least 150 minutes each week. The exercise should increase your heart rate and make you sweat (moderate-intensity exercise).  Do strengthening exercises at least twice a week. This is in addition to the moderate-intensity exercise.  Spend less time sitting. Even light physical activity can be beneficial. Watch cholesterol and blood lipids Have your blood tested for lipids and cholesterol at 37 years of age, then have this test every 5 years. Have your cholesterol levels checked more often if:  Your lipid or cholesterol levels are high.  You are older than 37 years of age.  You are at high risk for heart disease. What should I know about cancer screening? Depending on your health history and family history, you may need to have cancer screening at various ages. This may include screening for:  Breast cancer.  Cervical cancer.  Colorectal cancer.  Skin cancer.  Lung cancer. What should I know about heart disease, diabetes, and high blood  pressure? Blood pressure and heart disease  High blood pressure causes heart disease and increases the risk of stroke. This is more likely to develop in people who have high blood pressure readings, are of African descent, or are overweight.  Have your blood pressure checked: ? Every 3-5 years if you are 18-39 years of age. ? Every year if you are 40 years old or older. Diabetes Have regular diabetes screenings. This checks your fasting blood sugar level. Have the screening done:  Once every three years after age 40 if you are at a normal weight and have a low risk for diabetes.  More often and at a younger age if you are overweight or have a high risk for diabetes. What should I know about preventing infection? Hepatitis B If you have a higher risk for hepatitis B, you should be screened for this virus. Talk with your health care provider to find out if you are at risk for hepatitis B infection. Hepatitis C Testing is recommended for:  Everyone born from 1945 through 1965.  Anyone with known risk factors for hepatitis C. Sexually transmitted infections (STIs)  Get screened for STIs, including gonorrhea and chlamydia, if: ? You are sexually active and are younger than 37 years of age. ? You are older than 37 years of age and your health care provider tells you that you are at risk for this type of infection. ? Your sexual activity has changed since you were last screened, and you are at increased risk for chlamydia or gonorrhea. Ask your health care provider   if you are at risk.  Ask your health care provider about whether you are at high risk for HIV. Your health care provider may recommend a prescription medicine to help prevent HIV infection. If you choose to take medicine to prevent HIV, you should first get tested for HIV. You should then be tested every 3 months for as long as you are taking the medicine. Pregnancy  If you are about to stop having your period (premenopausal) and  you may become pregnant, seek counseling before you get pregnant.  Take 400 to 800 micrograms (mcg) of folic acid every day if you become pregnant.  Ask for birth control (contraception) if you want to prevent pregnancy. Osteoporosis and menopause Osteoporosis is a disease in which the bones lose minerals and strength with aging. This can result in bone fractures. If you are 65 years old or older, or if you are at risk for osteoporosis and fractures, ask your health care provider if you should:  Be screened for bone loss.  Take a calcium or vitamin D supplement to lower your risk of fractures.  Be given hormone replacement therapy (HRT) to treat symptoms of menopause. Follow these instructions at home: Lifestyle  Do not use any products that contain nicotine or tobacco, such as cigarettes, e-cigarettes, and chewing tobacco. If you need help quitting, ask your health care provider.  Do not use street drugs.  Do not share needles.  Ask your health care provider for help if you need support or information about quitting drugs. Alcohol use  Do not drink alcohol if: ? Your health care provider tells you not to drink. ? You are pregnant, may be pregnant, or are planning to become pregnant.  If you drink alcohol: ? Limit how much you use to 0-1 drink a day. ? Limit intake if you are breastfeeding.  Be aware of how much alcohol is in your drink. In the [place] one drink equals one 12 oz bottle of beer (355 mL), one 5 oz glass of wine (148 mL), or one 1 oz glass of hard liquor (44 mL). General instructions  Schedule regular health, dental, and eye exams.  Stay current with your vaccines.  Tell your health care provider if: ? You often feel depressed. ? You have ever been abused or do not feel safe at home. Summary  Adopting a healthy lifestyle and getting preventive care are important in promoting health and wellness.  Follow your health care provider's instructions about healthy  diet, exercising, and getting tested or screened for diseases.  Follow your health care provider's instructions on monitoring your cholesterol and blood pressure. This information is not intended to replace advice given to you by your health care provider. Make sure you discuss any questions you have with your health care provider. Document Revised: 08/01/2018 Document Reviewed: 08/01/2018 Elsevier Patient Education  2021 Elsevier Inc.  

## 2020-10-05 NOTE — Progress Notes (Signed)
    Post Partum Visit Note  [name] is a 35 y.o. [phone] female who presents for a postpartum visit. She is 5 weeks postpartum following a normal spontaneous vaginal delivery.  I have fully reviewed the prenatal and intrapartum course. The delivery was at 37/6 gestational weeks.  Anesthesia: none. Postpartum course has been unremarkable. Baby is doing well. Baby is feeding by both breast and bottle - [employer] Good Start. Bleeding no bleeding. Bowel function is normal. Bladder function is normal. Patient is not sexually active. Contraception method is IUD. Postpartum depression screening: negative.   The pregnancy intention screening data noted above was reviewed. Potential methods of contraception were discussed. The patient elected to proceed with IUD or IUS.      The following portions of the patient's history were reviewed and updated as appropriate: allergies, current medications, past family history, past medical history, past social history, past surgical history and problem list.   Review of Systems Pertinent items are noted in HPI.    Objective:  LMP 12/08/2019 (Exact Date)    General:  alert   Breasts:  deferred  Lungs: clear to auscultation bilaterally  Heart:  regular rate and rhythm, S1, S2 normal, no murmur, click, rub or gallop  Abdomen: soft, non-tender; bowel sounds normal; no masses,  no organomegaly   Vulva:  normal  Vagina: normal vagina  Cervix:  multiparous appearance, IUD string noted and trimmed to 3 cm   Corpus: normal  Adnexa:  normal adnexa  Rectal Exam: Not performed.        Assessment:    nl postpartum exam. Pap smear not done at today's visit.   Plan:   Essential components of care per ACOG recommendations:  1.  Mood and well being: Patient with negative depression screening today. Reviewed local resources for support.  - Patient does not use tobacco. - hx of drug use? No   2. Infant care and feeding:  -Patient currently breastmilk  feeding? Yes  If breastmilk feeding discussed return to work and pumping. If needed, patient was provided letter for work to allow for every 2-3 hr pumping breaks, and to be granted a private location to express breastmilk and refrigerated area to store breastmilk. Reviewed importance of draining breast regularly to support lactation. -Social determinants of health (SDOH) reviewed in EPIC. No concern   3. Sexuality, contraception and birth spacing - Patient does not want a pregnancy in the next year.  Desired family size is uncertain children.  - Reviewed forms of contraception in tiered fashion. Patient had PP IUD placed  4. Sleep and fatigue -Encouraged family/partner/community support of 4 hrs of uninterrupted sleep to help with mood and fatigue  5. Physical Recovery  - Discussed patients delivery and complications -- Patient has urinary incontinence? No - Patient is safe to resume physical and sexual activity  6.  Health Maintenance - Last pap smear done 2/21 and was normal with negative HPV.   7.Chronic Disease  NA  Henrietta Dine, CMA [facility] [employer] [facility]
